# Patient Record
Sex: Male | Born: 1975 | Race: Black or African American | Hispanic: No | Marital: Single
Health system: Midwestern US, Community
[De-identification: ages and names within clinical notes are randomized; demographics above are authoritative.]

## PROBLEM LIST (undated history)

## (undated) HISTORY — PX: OTHER SURGICAL HISTORY: SHX169

---

## 2007-08-23 ENCOUNTER — Emergency Department: Payer: Self-pay | Admitting: Unknown Physician Specialty

## 2008-02-11 ENCOUNTER — Emergency Department: Payer: Self-pay | Admitting: Internal Medicine

## 2008-05-25 ENCOUNTER — Emergency Department: Payer: Self-pay | Admitting: Emergency Medicine

## 2008-06-20 ENCOUNTER — Emergency Department: Payer: Self-pay | Admitting: Emergency Medicine

## 2008-08-12 ENCOUNTER — Emergency Department: Payer: Self-pay | Admitting: Emergency Medicine

## 2008-12-02 ENCOUNTER — Emergency Department: Payer: Self-pay | Admitting: Internal Medicine

## 2009-07-17 ENCOUNTER — Inpatient Hospital Stay (HOSPITAL_COMMUNITY): Admission: EM | Admit: 2009-07-17 | Discharge: 2009-07-31 | Payer: Self-pay | Admitting: Emergency Medicine

## 2009-08-26 ENCOUNTER — Ambulatory Visit (HOSPITAL_COMMUNITY): Admission: RE | Admit: 2009-08-26 | Discharge: 2009-08-26 | Payer: Self-pay | Admitting: General Surgery

## 2009-09-22 ENCOUNTER — Emergency Department: Payer: Self-pay | Admitting: Emergency Medicine

## 2010-01-18 ENCOUNTER — Emergency Department: Payer: Self-pay | Admitting: Emergency Medicine

## 2010-02-23 ENCOUNTER — Emergency Department (HOSPITAL_BASED_OUTPATIENT_CLINIC_OR_DEPARTMENT_OTHER)
Admission: EM | Admit: 2010-02-23 | Discharge: 2010-02-23 | Disposition: A | Discharge status: Home / Self Care | Payer: Self-pay | Attending: Emergency Medicine | Admitting: Emergency Medicine

## 2010-02-23 DIAGNOSIS — R0602 Shortness of breath: Secondary | ICD-10-CM | POA: Insufficient documentation

## 2010-02-23 DIAGNOSIS — J45901 Unspecified asthma with (acute) exacerbation: Secondary | ICD-10-CM | POA: Insufficient documentation

## 2010-03-17 LAB — CBC
HCT: 42.3 % (ref 39.0–52.0)
Platelets: 184 10*3/uL (ref 150–400)
RBC: 4.96 MIL/uL (ref 4.22–5.81)

## 2010-03-17 LAB — SURGICAL PCR SCREEN
MRSA, PCR: NEGATIVE
Staphylococcus aureus: NEGATIVE

## 2010-03-18 LAB — ABO/RH: ABO/RH(D): B POS

## 2010-03-18 LAB — CBC
HCT: 36.7 % — ABNORMAL LOW (ref 39.0–52.0)
HCT: 37.7 % — ABNORMAL LOW (ref 39.0–52.0)
HCT: 43.5 % (ref 39.0–52.0)
Hemoglobin: 12.5 g/dL — ABNORMAL LOW (ref 13.0–17.0)
Hemoglobin: 12.7 g/dL — ABNORMAL LOW (ref 13.0–17.0)
Hemoglobin: 14 g/dL (ref 13.0–17.0)
MCH: 30.2 pg (ref 26.0–34.0)
MCHC: 34 g/dL (ref 30.0–36.0)
MCHC: 34.1 g/dL (ref 30.0–36.0)
MCV: 89.1 fL (ref 78.0–100.0)
MCV: 89.4 fL (ref 78.0–100.0)
Platelets: 132 10*3/uL — ABNORMAL LOW (ref 150–400)
Platelets: 143 10*3/uL — ABNORMAL LOW (ref 150–400)
Platelets: 206 10*3/uL (ref 150–400)
RBC: 4.21 MIL/uL — ABNORMAL LOW (ref 4.22–5.81)
RBC: 4.65 MIL/uL (ref 4.22–5.81)
RDW: 12.3 % (ref 11.5–15.5)
WBC: 12 10*3/uL — ABNORMAL HIGH (ref 4.0–10.5)
WBC: 8.1 10*3/uL (ref 4.0–10.5)
WBC: 9.9 10*3/uL (ref 4.0–10.5)

## 2010-03-18 LAB — COMPREHENSIVE METABOLIC PANEL
ALT: 20 U/L (ref 0–53)
Albumin: 3.9 g/dL (ref 3.5–5.2)
Alkaline Phosphatase: 51 U/L (ref 39–117)
BUN: 13 mg/dL (ref 6–23)
Calcium: 8.5 mg/dL (ref 8.4–10.5)
Glucose, Bld: 153 mg/dL — ABNORMAL HIGH (ref 70–99)
Potassium: 3.1 mEq/L — ABNORMAL LOW (ref 3.5–5.1)
Sodium: 140 mEq/L (ref 135–145)
Total Protein: 6.4 g/dL (ref 6.0–8.3)

## 2010-03-18 LAB — BASIC METABOLIC PANEL
CO2: 30 mEq/L (ref 19–32)
Calcium: 8 mg/dL — ABNORMAL LOW (ref 8.4–10.5)
Chloride: 96 mEq/L (ref 96–112)
Creatinine, Ser: 1.06 mg/dL (ref 0.4–1.5)
Creatinine, Ser: 1.21 mg/dL (ref 0.4–1.5)
GFR calc Af Amer: >60 mL/min (ref >60)
GFR calc Af Amer: >60 mL/min (ref >60)
GFR calc non Af Amer: >60 mL/min (ref >60)
Potassium: 3.7 mEq/L (ref 3.5–5.1)
Sodium: 131 mEq/L — ABNORMAL LOW (ref 135–145)
Sodium: 133 mEq/L — ABNORMAL LOW (ref 135–145)

## 2010-03-18 LAB — POCT I-STAT, CHEM 8
Creatinine, Ser: 1.4 mg/dL (ref 0.4–1.5)
Glucose, Bld: 145 mg/dL — ABNORMAL HIGH (ref 70–99)
Potassium: 3.1 mEq/L — ABNORMAL LOW (ref 3.5–5.1)

## 2010-03-18 LAB — PROTIME-INR
INR: 0.99 (ref 0.00–1.49)
Prothrombin Time: 13 seconds (ref 11.6–15.2)

## 2010-03-18 LAB — TYPE AND SCREEN: Antibody Screen: NEGATIVE

## 2010-03-18 LAB — LACTIC ACID, PLASMA: Lactic Acid, Venous: 4.1 mmol/L — ABNORMAL HIGH (ref 0.5–2.2)

## 2010-08-08 ENCOUNTER — Emergency Department (HOSPITAL_BASED_OUTPATIENT_CLINIC_OR_DEPARTMENT_OTHER)
Admission: EM | Admit: 2010-08-08 | Discharge: 2010-08-08 | Disposition: A | Discharge status: Home / Self Care | Payer: Self-pay | Attending: Emergency Medicine | Admitting: Emergency Medicine

## 2010-08-08 DIAGNOSIS — R0602 Shortness of breath: Secondary | ICD-10-CM | POA: Insufficient documentation

## 2010-08-08 DIAGNOSIS — J45909 Unspecified asthma, uncomplicated: Secondary | ICD-10-CM | POA: Insufficient documentation

## 2010-08-08 MED ORDER — IPRATROPIUM BROMIDE 0.02 % IN SOLN
0.5000 mg | Freq: Once | RESPIRATORY_TRACT | Status: AC
  Administered 2010-08-08: 0.5 mg via RESPIRATORY_TRACT
  Filled 2010-08-08: qty 2.5

## 2010-08-08 MED ORDER — FLUTICASONE PROPIONATE HFA 110 MCG/ACT IN AERO: 2.0000 | INHALATION_SPRAY | Freq: Two times a day (BID) | RESPIRATORY_TRACT | Status: DC

## 2010-08-08 MED ORDER — ALBUTEROL SULFATE HFA 108 (90 BASE) MCG/ACT IN AERS
INHALATION_SPRAY | RESPIRATORY_TRACT | Status: AC
  Filled 2010-08-08: qty 6.7

## 2010-08-08 MED ORDER — ALBUTEROL SULFATE (5 MG/ML) 0.5% IN NEBU
5.0000 mg | INHALATION_SOLUTION | Freq: Once | RESPIRATORY_TRACT | Status: AC
  Administered 2010-08-08: 5 mg via RESPIRATORY_TRACT
  Filled 2010-08-08: qty 1

## 2010-08-08 MED ORDER — ALBUTEROL SULFATE HFA 108 (90 BASE) MCG/ACT IN AERS: 1.0000 | INHALATION_SPRAY | Freq: Four times a day (QID) | RESPIRATORY_TRACT | Status: DC | PRN

## 2010-08-08 MED ORDER — PREDNISONE 10 MG PO TABS: 20.0000 mg | ORAL_TABLET | Freq: Every day | ORAL | Status: AC

## 2010-08-08 MED ORDER — PREDNISONE 20 MG PO TABS
60.0000 mg | ORAL_TABLET | Freq: Once | ORAL | Status: AC
  Administered 2010-08-08: 60 mg via ORAL
  Filled 2010-08-08: qty 3

## 2010-08-08 MED ORDER — ALBUTEROL SULFATE HFA 108 (90 BASE) MCG/ACT IN AERS
2.0000 | INHALATION_SPRAY | Freq: Once | RESPIRATORY_TRACT | Status: AC
  Administered 2010-08-08: 2 via RESPIRATORY_TRACT

## 2010-08-08 NOTE — ED Notes (Signed)
Albuterol MDI given to pt at DC per MD order.  RX label filled out per order. Pt states he demonstrates proper use of MDI with Aerochamber.

## 2010-08-08 NOTE — ED Notes (Signed)
MD at bedside.  Neb tx being administered.

## 2010-08-08 NOTE — ED Notes (Signed)
Pt has a hx of asthma and reports an increase in [facility] and having to use his inhaler.

## 2010-08-08 NOTE — ED Notes (Signed)
Pt requesting Albuterol inhaler prior to d/c.  MD informed and new orders received.

## 2010-08-08 NOTE — ED Provider Notes (Signed)
History     CSN: [id number] Arrival date & time: 08/08/2010  7:17 AM  Chief Complaint  Patient presents with  . Shortness of Breath  . Asthma   Patient is a 35 y.o. male presenting with shortness of breath and asthma. The history is provided by the patient. No language interpreter was used.  Shortness of Breath  The current episode started 5 to 7 days ago. The onset was gradual. The problem occurs frequently. The problem has been unchanged. The problem is moderate. The symptoms are relieved by beta-agonist inhalers. The symptoms are aggravated by smoke exposure and allergens. Associated symptoms include cough, shortness of breath and wheezing. Pertinent negatives include no chest pain, no chest pressure, no orthopnea, no fever, no rhinorrhea, no sore throat and no stridor. There was no intake of a foreign body. He has inhaled smoke recently. He has had no prior steroid use. He has had no prior hospitalizations. He has had no prior ICU admissions. He has had no prior intubations. His past medical history is significant for asthma. He has been behaving normally.  Asthma Associated symptoms include shortness of breath. Pertinent negatives include no chest pain.    Past Medical History  Diagnosis Date  . Asthma     Past Surgical History  Procedure Date  . Left knee surery   . Left hand surgery     No family history on file.  History  Substance Use Topics  . Smoking status: Former Smoker    Quit date: 06/08/2010  . Smokeless tobacco: Not on file  . Alcohol Use: No      Review of Systems  Constitutional: Negative for fever and activity change.  HENT: Negative for sore throat, facial swelling and rhinorrhea.   Eyes: Negative for discharge and itching.  Respiratory: Positive for cough, shortness of breath and wheezing. Negative for apnea, chest tightness and stridor.   Cardiovascular: Negative for chest pain and orthopnea.  Gastrointestinal: Negative for abdominal distention.    Genitourinary: Negative for difficulty urinating.  Musculoskeletal: Negative.   Neurological: Negative.   Hematological: Negative.   Psychiatric/Behavioral: Negative.     Physical Exam  BP 123/71  Pulse 80  Temp(Src) 97.9 F (36.6 C) (Oral)  Resp 20  Ht 5\' 11"  (1.803 m)  Wt 215 lb (97.523 kg)  BMI 29.99 kg/m2  SpO2 95%  Physical Exam  Constitutional: He is oriented to person, place, and time. He appears well-developed and well-nourished. No distress.  HENT:  Head: Normocephalic and atraumatic.  Eyes: EOM are normal. Pupils are equal, round, and reactive to light.  Neck: Normal range of motion. Neck supple.  Cardiovascular: Normal rate and regular rhythm.   Pulmonary/Chest: No stridor. No respiratory distress. He has wheezes. He has no rales.  Abdominal: Soft. Bowel sounds are normal.  Musculoskeletal: He exhibits no edema.  Neurological: He is alert and oriented to person, place, and time.  Skin: Skin is warm and dry.  Psychiatric: He has a normal mood and affect.    ED Course  Procedures  MDM   Patient 70% improved following one treatment.  Prednisone 60 mg given following first neb tx. Advised will start Flovent, steroids, and new inhaler. Return for chest pain DOE, SOB, productive cough, fevers or chills or any concerning symptoms. Patient advised he should follow up with PMD.  Expresses understanding and agrees to follow up      Garrison Michie Smitty Cords, MD 08/08/10 0800

## 2010-08-08 NOTE — ED Notes (Signed)
MD at bedside. New orders received for #2 neb tx.

## 2010-11-07 ENCOUNTER — Emergency Department (HOSPITAL_BASED_OUTPATIENT_CLINIC_OR_DEPARTMENT_OTHER)
Admission: EM | Admit: 2010-11-07 | Discharge: 2010-11-07 | Disposition: A | Discharge status: Home / Self Care | Payer: Self-pay | Attending: Emergency Medicine | Admitting: Emergency Medicine

## 2010-11-07 ENCOUNTER — Encounter (HOSPITAL_BASED_OUTPATIENT_CLINIC_OR_DEPARTMENT_OTHER): Payer: Self-pay | Admitting: *Deleted

## 2010-11-07 DIAGNOSIS — J45909 Unspecified asthma, uncomplicated: Secondary | ICD-10-CM | POA: Insufficient documentation

## 2010-11-07 MED ORDER — IPRATROPIUM BROMIDE 0.02 % IN SOLN
0.5000 mg | Freq: Once | RESPIRATORY_TRACT | Status: AC
  Administered 2010-11-07: 0.5 mg via RESPIRATORY_TRACT
  Filled 2010-11-07: qty 2.5

## 2010-11-07 MED ORDER — ALBUTEROL SULFATE HFA 108 (90 BASE) MCG/ACT IN AERS: 2.0000 | INHALATION_SPRAY | RESPIRATORY_TRACT | Status: DC | PRN

## 2010-11-07 MED ORDER — ALBUTEROL SULFATE (5 MG/ML) 0.5% IN NEBU
5.0000 mg | INHALATION_SOLUTION | Freq: Once | RESPIRATORY_TRACT | Status: AC
  Administered 2010-11-07: 5 mg via RESPIRATORY_TRACT
  Filled 2010-11-07: qty 1

## 2010-11-07 MED ORDER — PREDNISONE 20 MG PO TABS: 40.0000 mg | ORAL_TABLET | Freq: Every day | ORAL | Status: AC

## 2010-11-07 NOTE — ED Provider Notes (Signed)
History     CSN: [id number] Arrival date & time: 11/07/2010  1:12 PM   First MD Initiated Contact with Patient 11/07/10 1301      Chief Complaint  Patient presents with  . Asthma    (Consider location/radiation/quality/duration/timing/severity/associated sxs/prior treatment) Patient is a 35 y.o. male presenting with asthma. The history is provided by the patient. No language interpreter was used.  Asthma This is a recurrent problem. The current episode started in the past 7 days. The problem occurs constantly. The problem has been unchanged. Pertinent negatives include no coughing, fever, rash, sore throat or vomiting. The symptoms are aggravated by nothing. Treatments tried: inhaler. The treatment provided mild relief.  Asthma This is a recurrent problem. The current episode started in the past 7 days. The problem occurs constantly. The problem has been unchanged. The symptoms are aggravated by nothing. Treatments tried: inhaler. The treatment provided mild relief.    Past Medical History  Diagnosis Date  . Asthma     Past Surgical History  Procedure Date  . Left knee surery   . Left hand surgery     No family history on file.  History  Substance Use Topics  . Smoking status: Former Smoker    Quit date: 06/08/2010  . Smokeless tobacco: Not on file  . Alcohol Use: No      Review of Systems  Constitutional: Negative for fever.  HENT: Negative for sore throat.   Respiratory: Negative for cough.   Gastrointestinal: Negative for vomiting.  Skin: Negative for rash.  All other systems reviewed and are negative.    Allergies  Review of patient's allergies indicates no known allergies.  Home Medications   Current Outpatient Rx  Name Route Sig Dispense Refill  . ALBUTEROL SULFATE HFA 108 (90 BASE) MCG/ACT IN AERS Inhalation Inhale 1-2 puffs into the lungs every 6 (six) hours as needed for wheezing. 1 Inhaler 0  . ALBUTEROL SULFATE IN Inhalation Inhale into the  lungs as needed.      Marland Kitchen FLUTICASONE PROPIONATE  HFA 110 MCG/ACT IN AERO Inhalation Inhale 2 puffs into the lungs 2 (two) times daily. 1 Inhaler 0    BP 128/75  Pulse 78  Temp(Src) 98.6 F (37 C) (Oral)  Resp 22  Ht 5\' 11"  (1.803 m)  Wt 210 lb (95.255 kg)  BMI 29.29 kg/m2  SpO2 100%  Physical Exam  Nursing note and vitals reviewed. Constitutional: He is oriented to person, place, and time. He appears well-developed and well-nourished.  HENT:  Head: Normocephalic and atraumatic.  Right Ear: External ear normal.  Left Ear: External ear normal.  Mouth/Throat: Oropharynx is clear and moist.  Eyes: Pupils are equal, round, and reactive to light.  Neck: Normal range of motion. Neck supple.  Cardiovascular: Normal rate and regular rhythm.   Pulmonary/Chest: No accessory muscle usage. No respiratory distress. He has wheezes.  Abdominal: Soft. Bowel sounds are normal.  Musculoskeletal: Normal range of motion.  Neurological: He is alert and oriented to person, place, and time.  Skin: Skin is warm and dry.  Psychiatric: He has a normal mood and affect.    ED Course  Procedures (including critical care time)  Labs Reviewed - No data to display No results found.   1. Asthma       MDM  Pt no longer wheezing;will give him an inhaler for home   Medical screening examination/treatment/procedure(s) were performed by non-physician practitioner and as supervising physician I was immediately available for consultation/collaboration. Jorge Cooper  Thomas, M.D.     Teressa Lower, NP 11/07/10 1342  Jorge Cooper III, MD 11/07/10 [phone]

## 2010-11-07 NOTE — ED Notes (Signed)
Patient states he has chronic asthma.  States for the last 3-4 days he has had to use his Albuterol inhaler more often up to 5-6 times per day.
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
## 2010-12-12 ENCOUNTER — Emergency Department: Payer: Self-pay | Admitting: Emergency Medicine

## 2011-03-15 ENCOUNTER — Encounter (HOSPITAL_BASED_OUTPATIENT_CLINIC_OR_DEPARTMENT_OTHER): Payer: Self-pay | Admitting: Family Medicine

## 2011-03-15 ENCOUNTER — Emergency Department (HOSPITAL_BASED_OUTPATIENT_CLINIC_OR_DEPARTMENT_OTHER)
Admission: EM | Admit: 2011-03-15 | Discharge: 2011-03-15 | Disposition: A | Discharge status: Home / Self Care | Payer: Self-pay | Attending: Emergency Medicine | Admitting: Emergency Medicine

## 2011-03-15 DIAGNOSIS — Z87891 Personal history of nicotine dependence: Secondary | ICD-10-CM | POA: Insufficient documentation

## 2011-03-15 DIAGNOSIS — R0981 Nasal congestion: Secondary | ICD-10-CM

## 2011-03-15 DIAGNOSIS — B9789 Other viral agents as the cause of diseases classified elsewhere: Secondary | ICD-10-CM | POA: Insufficient documentation

## 2011-03-15 DIAGNOSIS — J069 Acute upper respiratory infection, unspecified: Secondary | ICD-10-CM | POA: Insufficient documentation

## 2011-03-15 DIAGNOSIS — J45909 Unspecified asthma, uncomplicated: Secondary | ICD-10-CM | POA: Insufficient documentation

## 2011-03-15 MED ORDER — PREDNISONE 20 MG PO TABS: 40.0000 mg | ORAL_TABLET | Freq: Every day | ORAL | Status: AC

## 2011-03-15 MED ORDER — BENZONATATE 100 MG PO CAPS: 100.0000 mg | ORAL_CAPSULE | Freq: Two times a day (BID) | ORAL | Status: AC | PRN

## 2011-03-15 MED ORDER — LORATADINE-PSEUDOEPHEDRINE ER 5-120 MG PO TB12: 1.0000 | ORAL_TABLET | Freq: Two times a day (BID) | ORAL | Status: AC

## 2011-03-15 NOTE — ED Provider Notes (Signed)
History     CSN: [id number]  Arrival date & time 03/15/11  4098   First MD Initiated Contact with Patient 03/15/11 [phone]      Chief Complaint  Patient presents with  . Cough  . Nasal Congestion    (Consider location/radiation/quality/duration/timing/severity/associated sxs/prior treatment) Patient is a 36 y.o. male presenting with URI. The history is provided by the patient.  URI The primary symptoms include cough and wheezing. Primary symptoms do not include fever, fatigue, headaches, ear pain, sore throat, swollen glands, abdominal pain, nausea, vomiting, myalgias, arthralgias or rash. The current episode started 3 to 5 days ago. This is a new problem. The problem has been gradually worsening.  The cough began 3 to 5 days ago. The cough is new. The cough is productive. The sputum is yellow.  Wheezing began yesterday. Wheezing occurs intermittently. The wheezing has been resolved since its onset. The patient's medical history is significant for asthma.  The onset of the illness is associated with exposure to sick contacts. Symptoms associated with the illness include rhinorrhea. The illness is not associated with chills, plugged ear sensation, facial pain, sinus pressure or congestion.    Past Medical History  Diagnosis Date  . Asthma     Past Surgical History  Procedure Date  . Left knee surery   . Left hand surgery     No family history on file.  History  Substance Use Topics  . Smoking status: Former Smoker    Quit date: 06/08/2010  . Smokeless tobacco: Not on file  . Alcohol Use: 0.0 oz/week    1-2 Cans of beer per week     daily      Review of Systems  Constitutional: Negative for fever, chills, diaphoresis, activity change, appetite change, fatigue and unexpected weight change.  HENT: Positive for rhinorrhea. Negative for hearing loss, ear pain, nosebleeds, congestion, sore throat, facial swelling, sneezing, drooling, mouth sores, trouble swallowing, neck  pain, neck stiffness, dental problem, voice change, postnasal drip, sinus pressure, tinnitus and ear discharge.   Eyes: Negative for photophobia, pain, discharge, redness, itching and visual disturbance.  Respiratory: Positive for cough and wheezing. Negative for chest tightness and shortness of breath.   Cardiovascular: Negative for chest pain and leg swelling.  Gastrointestinal: Negative for nausea, vomiting, abdominal pain and diarrhea.  Genitourinary: Negative for dysuria and flank pain.  Musculoskeletal: Negative for myalgias, back pain, joint swelling and arthralgias.  Skin: Negative for color change, pallor, rash and wound.  Neurological: Negative for dizziness, speech difficulty, light-headedness, numbness and headaches.  Hematological: Negative for adenopathy.  Psychiatric/Behavioral: Negative.     Allergies  Review of patient's allergies indicates no known allergies.  Home Medications   Current Outpatient Rx  Name Route Sig Dispense Refill  . ALBUTEROL SULFATE HFA 108 (90 BASE) MCG/ACT IN AERS Inhalation Inhale 1-2 puffs into the lungs every 6 (six) hours as needed for wheezing. 1 Inhaler 0  . ALBUTEROL SULFATE HFA 108 (90 BASE) MCG/ACT IN AERS Inhalation Inhale 2 puffs into the lungs every 4 (four) hours as needed for wheezing. 1 Inhaler 0  . ALBUTEROL SULFATE IN Inhalation Inhale into the lungs as needed.      Marland Kitchen BENZONATATE 100 MG PO CAPS Oral Take 1 capsule (100 mg total) by mouth 2 (two) times daily as needed for cough. 14 capsule 0  . FLUTICASONE PROPIONATE  HFA 110 MCG/ACT IN AERO Inhalation Inhale 2 puffs into the lungs 2 (two) times daily. 1 Inhaler 0  . LORATADINE-PSEUDOEPHEDRINE  ER 5-120 MG PO TB12 Oral Take 1 tablet by mouth 2 (two) times daily. As needed for nasal congestion 10 tablet 0  . PREDNISONE 20 MG PO TABS Oral Take 2 tablets (40 mg total) by mouth daily. 10 tablet 0    BP 144/63  Pulse 60  Temp(Src) 97.6 F (36.4 C) (Oral)  Resp 16  Ht 5\' 11"  (1.803 m)   Wt 203 lb (92.08 kg)  BMI 28.31 kg/m2  SpO2 100%  Physical Exam  Nursing note and vitals reviewed. Constitutional: He is oriented to person, place, and time. He appears well-developed and well-nourished. No distress.  HENT:  Head: Normocephalic and atraumatic. Head is without raccoon's eyes, without Battle's sign, without right periorbital erythema and without left periorbital erythema.  Right Ear: Hearing, tympanic membrane, external ear and ear canal normal.  Left Ear: Hearing, tympanic membrane, external ear and ear canal normal.  Nose: Mucosal edema and rhinorrhea present. Right sinus exhibits no maxillary sinus tenderness and no frontal sinus tenderness. Left sinus exhibits no maxillary sinus tenderness and no frontal sinus tenderness.  Mouth/Throat: Uvula is midline, oropharynx is clear and moist and mucous membranes are normal. Mucous membranes are not dry. No oropharyngeal exudate, posterior oropharyngeal edema, posterior oropharyngeal erythema or tonsillar abscesses.  Eyes: Conjunctivae and EOM are normal.  Neck: Normal range of motion. Neck supple. No JVD present. No tracheal deviation present.  Cardiovascular: Normal rate, regular rhythm, normal heart sounds and intact distal pulses.  Exam reveals no gallop and no friction rub.   No murmur heard. Pulmonary/Chest: Effort normal and breath sounds normal. No stridor. No respiratory distress. He has no wheezes. He has no rales. He exhibits no tenderness.  Abdominal: Soft. Bowel sounds are normal. He exhibits no distension. There is no tenderness. There is no rebound and no guarding.  Musculoskeletal: Normal range of motion. He exhibits no edema and no tenderness.  Lymphadenopathy:    He has no cervical adenopathy.  Neurological: He is alert and oriented to person, place, and time. He has normal reflexes. No cranial nerve deficit. He exhibits normal muscle tone. Coordination normal.  Skin: Skin is warm and dry. No rash noted. He is not  diaphoretic. No erythema. No pallor.  Psychiatric: He has a normal mood and affect. His behavior is normal. Judgment and thought content normal.    ED Course  Procedures (including critical care time)  Labs Reviewed - No data to display No results found.   1. Viral upper respiratory tract infection with cough   2. Nasal congestion       MDM  The patient has findings of a viral upper respiratory tract infection with nasal congestion and a productive cough, but his lung exam does not suggest pneumonia. He is not currently wheezing, but has a history of asthma and reports increased exacerbations with upper respiratory tract infection, controlled by albuterol. I do not see indication for a chest x-ray as I do not suspect pneumonia, and I do suspect a viral upper respiratory tract infection which I will treat symptomatically with decongestant and cough suppressant, and due to the patient's history of asthma and more frequent recent exacerbations, I will give him a short course of steroid. The patient states his understanding of and agreement with the plan of care.        Felisa Bonier, MD 03/15/11 1016

## 2011-03-15 NOTE — ED Notes (Signed)
Pt c/o cough productive of yellow sputum, nasal congestion and "chest cold" x 2 days.

## 2011-03-15 NOTE — Discharge Instructions (Signed)
Antibiotic Nonuse  Your caregiver felt that the infection or problem was not one that would be helped with an antibiotic. Infections may be caused by viruses or bacteria. Only a caregiver can tell which one of these is the likely cause of an illness. A cold is the most common cause of infection in both adults and children. A cold is a virus. Antibiotic treatment will have no effect on a viral infection. Viruses can lead to many lost days of work caring for sick children and many missed days of school. Children may catch as many as 10 "colds" or "flus" per year during which they can be tearful, cranky, and uncomfortable. The goal of treating a virus is aimed at keeping the ill person comfortable. Antibiotics are medications used to help the body fight bacterial infections. There are relatively few types of bacteria that cause infections but there are hundreds of viruses. While both viruses and bacteria cause infection they are very different types of germs. A viral infection will typically go away by itself within 7 to 10 days. Bacterial infections may spread or get worse without antibiotic treatment. Examples of bacterial infections are:  Sore throats (like strep throat or tonsillitis).   Infection in the lung (pneumonia).   Ear and skin infections.  Examples of viral infections are:  Colds or flus.   Most coughs and bronchitis.   Sore throats not caused by Strep.   Runny noses.  It is often best not to take an antibiotic when a viral infection is the cause of the problem. Antibiotics can kill off the helpful bacteria that we have inside our body and allow harmful bacteria to start growing. Antibiotics can cause side effects such as allergies, nausea, and diarrhea without helping to improve the symptoms of the viral infection. Additionally, repeated uses of antibiotics can cause bacteria inside of our body to become resistant. That resistance can be passed onto harmful bacterial. The next time  you have an infection it may be harder to treat if antibiotics are used when they are not needed. Not treating with antibiotics allows our own immune system to develop and take care of infections more efficiently. Also, antibiotics will work better for [place] when they are prescribed for bacterial infections. Treatments for a child that is ill may include:  Give extra fluids throughout the day to stay hydrated.   Get plenty of rest.   Only give your child over-the-counter or prescription medicines for pain, discomfort, or fever as directed by your caregiver.   The use of a cool mist humidifier may help stuffy noses.   Cold medications if suggested by your caregiver.  Your caregiver may decide to start you on an antibiotic if:  The problem you were seen for today continues for a longer length of time than expected.   You develop a secondary bacterial infection.  SEEK MEDICAL CARE IF:  Fever lasts longer than 5 days.   Symptoms continue to get worse after 5 to 7 days or become severe.   Difficulty in breathing develops.   Signs of dehydration develop (poor drinking, rare urinating, dark colored urine).   Changes in behavior or worsening tiredness (listlessness or lethargy).  Document Released: 02/26/2001 Document Revised: 12/07/2010 Document Reviewed: 08/25/2008 [facility] Patient Information 2012 [place], [place].Cool Mist Vaporizers Vaporizers may help relieve the symptoms of a cough and cold. By adding water to the air, mucus may become thinner and less sticky. This makes it easier to breathe and cough up secretions. Vaporizers  have not been proven to show they help with colds. You should not use a vaporizer if you are allergic to mold. Cool mist vaporizers do not cause serious burns like hot mist vaporizers ("steamers"). HOME CARE INSTRUCTIONS  Follow the package instructions for your vaporizer.   Use a vaporizer that holds a large volume of water (1 to 2 gallons [5.7 to 7.5 liters]).     Do not use anything other than distilled water in the vaporizer.   Do not run the vaporizer all of the time. This can cause mold or bacteria to grow in the vaporizer.   Clean the vaporizer after each time you use it.   Clean and dry the vaporizer well before you store it.   Stop using a vaporizer if you develop worsening respiratory symptoms.  Document Released: 09/15/2003 Document Revised: 12/07/2010 Document Reviewed: 08/12/2008 [facility] Patient Information 2012 [place], [place].Saline Nose Drops  To help clear a stuffy nose, put salt water (saline) nose drops in your infant's nose. This helps to loosen the secretions in the nose. Use a bulb syringe to clean the nose out:  Before feeding.   Before putting your infant down for naps.   No more than once every 3 hours to avoid irritating your infant's nostrils.  HOME CARE  Buy nose drops at your local drug store. You can also make nose drops yourself. Mix 1 cup of water with  teaspoon of salt. Stir. Store this mixture at room temperature. Make a new batch daily.   To use the drops:   Put 1 or 2 drops in each side of infant's nose with a clean medicine dropper. Do not use this dropper for any other medicine.   Squeeze the air out of the suction bulb before inserting it into your infant's nose.   While still squeezing the bulb flat, place the tip of the bulb into a nostril. Let air come back into the bulb. The suction will pull snot out of the nose and into the bulb.   Repeat on other nostril.   Squeeze the bulb several times into a tissue and wash the bulb tip in soapy water. Store the bulb with the tip side down on paper towel.   Use the bulb syringe with only the saline drops to avoid irritating your infant's nostrils.  GET HELP RIGHT AWAY IF:  The snot changes to green or yellow.   The snot gets thicker.   Your infant is 3 months or younger with a rectal temperature of 100.4 F (38 C) or higher.   Your infant is older  than 3 months with a rectal temperature of 102 F (38.9 C) or higher.   The stuffy nose lasts 10 days or longer.   There is trouble breathing or feeding.  MAKE SURE YOU:  Understand these instructions.   Will watch your infant's condition.   Will get help right away if your infant is not doing well or gets worse.  Document Released: 10/15/2008 Document Revised: 12/07/2010 Document Reviewed: 10/15/2008 [facility] Patient Information 2012 [place], [place].Upper Respiratory Infection, Adult An upper respiratory infection (URI) is also sometimes known as the common cold. The upper respiratory tract includes the nose, sinuses, throat, trachea, and bronchi. Bronchi are the airways leading to the lungs. Most people improve within 1 week, but symptoms can last up to 2 weeks. A residual cough may last even longer.  CAUSES Many different viruses can infect the tissues lining the upper respiratory tract. The tissues become irritated  and inflamed and often become very moist. Mucus production is also common. A cold is contagious. You can easily spread the virus to others by oral contact. This includes kissing, sharing a glass, coughing, or sneezing. Touching your mouth or nose and then touching a surface, which is then touched by another person, can also spread the virus. SYMPTOMS  Symptoms typically develop 1 to 3 days after you come in contact with a cold virus. Symptoms vary from person to person. They may include:  Runny nose.   Sneezing.   Nasal congestion.   Sinus irritation.   Sore throat.   Loss of voice (laryngitis).   Cough.   Fatigue.   Muscle aches.   Loss of appetite.   Headache.   Low-grade fever.  DIAGNOSIS  You might diagnose your own cold based on familiar symptoms, since most people get a cold 2 to 3 times a year. Your caregiver can confirm this based on your exam. Most importantly, your caregiver can check that your symptoms are not due to another disease such as strep  throat, sinusitis, pneumonia, asthma, or epiglottitis. Blood tests, throat tests, and X-rays are not necessary to diagnose a common cold, but they may sometimes be helpful in excluding other more serious diseases. Your caregiver will decide if any further tests are required. RISKS AND COMPLICATIONS  You may be at risk for a more severe case of the common cold if you smoke cigarettes, have chronic heart disease (such as heart failure) or lung disease (such as asthma), or if you have a weakened immune system. The very young and very old are also at risk for more serious infections. Bacterial sinusitis, middle ear infections, and bacterial pneumonia can complicate the common cold. The common cold can worsen asthma and chronic obstructive pulmonary disease (COPD). Sometimes, these complications can require emergency medical care and may be life-threatening. PREVENTION  The best way to protect against getting a cold is to practice good hygiene. Avoid oral or hand contact with people with cold symptoms. Wash your hands often if contact occurs. There is no clear evidence that vitamin C, vitamin E, echinacea, or exercise reduces the chance of developing a cold. However, it is always recommended to get plenty of rest and practice good nutrition. TREATMENT  Treatment is directed at relieving symptoms. There is no cure. Antibiotics are not effective, because the infection is caused by a virus, not by bacteria. Treatment may include:  Increased fluid intake. Sports drinks offer valuable electrolytes, sugars, and fluids.   Breathing heated mist or steam (vaporizer or shower).   Eating chicken soup or other clear broths, and maintaining good nutrition.   Getting plenty of rest.   Using gargles or lozenges for comfort.   Controlling fevers with ibuprofen or acetaminophen as directed by your caregiver.   Increasing usage of your inhaler if you have asthma.  Zinc gel and zinc lozenges, taken in the first 24  hours of the common cold, can shorten the duration and lessen the severity of symptoms. Pain medicines may help with fever, muscle aches, and throat pain. A variety of non-prescription medicines are available to treat congestion and runny nose. Your caregiver can make recommendations and may suggest nasal or lung inhalers for other symptoms.  HOME CARE INSTRUCTIONS   Only take over-the-counter or prescription medicines for pain, discomfort, or fever as directed by your caregiver.   Use a warm mist humidifier or inhale steam from a shower to increase air moisture. This may keep secretions  moist and make it easier to breathe.   Drink enough water and fluids to keep your urine clear or pale yellow.   Rest as needed.   Return to work when your temperature has returned to normal or as your caregiver advises. You may need to stay home longer to avoid infecting others. You can also use a face mask and careful hand washing to prevent spread of the virus.  SEEK MEDICAL CARE IF:   After the first few days, you feel you are getting worse rather than better.   You need your caregiver's advice about medicines to control symptoms.   You develop chills, worsening shortness of breath, or brown or red sputum. These may be signs of pneumonia.   You develop yellow or brown nasal discharge or pain in the face, especially when you bend forward. These may be signs of sinusitis.   You develop a fever, swollen neck glands, pain with swallowing, or white areas in the back of your throat. These may be signs of strep throat.  SEEK IMMEDIATE MEDICAL CARE IF:   You have a fever.   You develop severe or persistent headache, ear pain, sinus pain, or chest pain.   You develop wheezing, a prolonged cough, cough up blood, or have a change in your usual mucus (if you have chronic lung disease).   You develop sore muscles or a stiff neck.  Document Released: 06/13/2000 Document Revised: 12/07/2010 Document Reviewed:  04/21/2010 [facility] Patient Information 2012 [place], [place].Viral Infections A viral infection can be caused by different types of viruses.Most viral infections are not serious and resolve on their own. However, some infections may cause severe symptoms and may lead to further complications. SYMPTOMS Viruses can frequently cause:  Minor sore throat.   Aches and pains.   Headaches.   Runny nose.   Different types of rashes.   Watery eyes.   Tiredness.   Cough.   Loss of appetite.   Gastrointestinal infections, resulting in nausea, vomiting, and diarrhea.  These symptoms do not respond to antibiotics because the infection is not caused by bacteria. However, you might catch a bacterial infection following the viral infection. This is sometimes called a "superinfection." Symptoms of such a bacterial infection may include:  Worsening sore throat with pus and difficulty swallowing.   Swollen neck glands.   Chills and a high or persistent fever.   Severe headache.   Tenderness over the sinuses.   Persistent overall ill feeling (malaise), muscle aches, and tiredness (fatigue).   Persistent cough.   Yellow, green, or brown mucus production with coughing.  HOME CARE INSTRUCTIONS   Only take over-the-counter or prescription medicines for pain, discomfort, diarrhea, or fever as directed by your caregiver.   Drink enough water and fluids to keep your urine clear or pale yellow. Sports drinks can provide valuable electrolytes, sugars, and hydration.   Get plenty of rest and maintain proper nutrition. Soups and broths with crackers or rice are fine.  SEEK IMMEDIATE MEDICAL CARE IF:   You have severe headaches, shortness of breath, chest pain, neck pain, or an unusual rash.   You have uncontrolled vomiting, diarrhea, or you are unable to keep down fluids.   You or your child has an oral temperature above 102 F (38.9 C), not controlled by medicine.   Your baby is older  than 3 months with a rectal temperature of 102 F (38.9 C) or higher.   Your baby is 67 months old or younger with a  rectal temperature of 100.4 F (38 C) or higher.  MAKE SURE YOU:   Understand these instructions.   Will watch your condition.   Will get help right away if you are not doing well or get worse.  Document Released: 09/27/2004 Document Revised: 12/07/2010 Document Reviewed: 04/24/2010  [facility] Patient Information 2012 [place], [place]. RESOURCE GUIDE  Dental Problems  Patients with Medicaid: [facility] [phone] [address]                                           [phone] [address] Phone:  [phone]                                                  Phone:  [phone]  If unable to pay or uninsured, contact:  [facility] or [facility]. to become qualified for the adult dental clinic.  Chronic Pain Problems Contact [facility]  [phone] Patients need to be referred by their primary care doctor.  Insufficient Money for Medicine Contact [employer]:  call "211" or [facility] (831) 135-8187.  No Primary Care Doctor Call [facility]  972-730-9262 Other agencies that provide inexpensive medical care    [facility]  [phone]    [facility]  [phone]    [facility]  [phone]    [facility]  [phone]    [facility]  6617200311    [facility]  [phone]  Psychological Services [facility]  [phone] [facility] Services  [phone] [facility]   [phone] (emergency services [phone])  Substance Abuse Resources [facility] Drug Services  [phone] [facility] 418-030-6463 [facility] 6676209239 Floydene Flock (917)198-2525 Residential & Outpatient Substance Abuse Program  450-845-9619  Abuse/Neglect [facility] Child Abuse Hotline (581) 824-7337 [facility] Child Abuse Hotline (254)790-0998 (After Hours)  Emergency Shelter [facility] [phone]  M[facility] [place] [facility] [phone] [facility] [phone]  MRSA Hotline #:   8202278252    [facility] Resources  [facility]     [employer]                          [facility] [address] [place]                       [address]      [address]  [place]                                                Cristobal Goldmann Phone:  [phone]  Phone:  [phone]                 Phone:  [phone]  [facility] Mental Health Phone:  [phone]  [facility] Child Abuse Hotline [phone] [phone] (After Hours)

## 2011-04-25 ENCOUNTER — Encounter (HOSPITAL_BASED_OUTPATIENT_CLINIC_OR_DEPARTMENT_OTHER): Payer: Self-pay | Admitting: *Deleted

## 2011-04-25 ENCOUNTER — Emergency Department (HOSPITAL_BASED_OUTPATIENT_CLINIC_OR_DEPARTMENT_OTHER)
Admission: EM | Admit: 2011-04-25 | Discharge: 2011-04-25 | Disposition: A | Discharge status: Home / Self Care | Payer: Self-pay | Attending: Emergency Medicine | Admitting: Emergency Medicine

## 2011-04-25 DIAGNOSIS — J45909 Unspecified asthma, uncomplicated: Secondary | ICD-10-CM | POA: Insufficient documentation

## 2011-04-25 DIAGNOSIS — R05 Cough: Secondary | ICD-10-CM | POA: Insufficient documentation

## 2011-04-25 DIAGNOSIS — Z87891 Personal history of nicotine dependence: Secondary | ICD-10-CM | POA: Insufficient documentation

## 2011-04-25 DIAGNOSIS — R059 Cough, unspecified: Secondary | ICD-10-CM | POA: Insufficient documentation

## 2011-04-25 MED ORDER — PREDNISONE 10 MG PO TABS: 20.0000 mg | ORAL_TABLET | Freq: Every day | ORAL | Status: DC

## 2011-04-25 NOTE — ED Provider Notes (Signed)
History     CSN: [id number]  Arrival date & time 04/25/11  [id number]   First MD Initiated Contact with Patient 04/25/11 [phone]      Chief Complaint  Patient presents with  . Cough    (Consider location/radiation/quality/duration/timing/severity/associated sxs/prior treatment) HPI  Patient with history of asthma and now with increased wheezing since last night.  Using albuterol mdi 4-5 times per day.  Patient on advair qam.  Cough with yellow sputum.  Sinuses draining with nasal congestion.  Patient not taking claritin but taking otc sinus pills.  Patient with seasonal allergies worse nowl  No fever or dyspnea.  Patient quit smoking several months ago, no pmd   Past Medical History  Diagnosis Date  . Asthma     Past Surgical History  Procedure Date  . Left knee surery   . Left hand surgery     History reviewed. No pertinent family history.  History  Substance Use Topics  . Smoking status: Former Smoker    Quit date: 06/08/2010  . Smokeless tobacco: Not on file  . Alcohol Use: 0.0 oz/week    1-2 Cans of beer per week     daily      Review of Systems  All other systems reviewed and are negative.    Allergies  Review of patient's allergies indicates no known allergies.  Home Medications   Current Outpatient Rx  Name Route Sig Dispense Refill  . ALBUTEROL SULFATE HFA 108 (90 BASE) MCG/ACT IN AERS Inhalation Inhale 1-2 puffs into the lungs every 6 (six) hours as needed for wheezing. 1 Inhaler 0  . ALBUTEROL SULFATE HFA 108 (90 BASE) MCG/ACT IN AERS Inhalation Inhale 2 puffs into the lungs every 4 (four) hours as needed for wheezing. 1 Inhaler 0  . ALBUTEROL SULFATE IN Inhalation Inhale into the lungs as needed.      Marland Kitchen FLUTICASONE PROPIONATE  HFA 110 MCG/ACT IN AERO Inhalation Inhale 2 puffs into the lungs 2 (two) times daily. 1 Inhaler 0  . LORATADINE-PSEUDOEPHEDRINE ER 5-120 MG PO TB12 Oral Take 1 tablet by mouth 2 (two) times daily. As needed for nasal congestion 10  tablet 0    BP 131/74  Pulse 74  Temp(Src) 98.1 F (36.7 C) (Oral)  Resp 18  SpO2 99%  Physical Exam  Nursing note and vitals reviewed. Constitutional: He is oriented to person, place, and time. He appears well-developed and well-nourished.  HENT:  Head: Normocephalic and atraumatic.  Right Ear: External ear normal.  Left Ear: External ear normal.  Nose: Nose normal.  Mouth/Throat: Oropharynx is clear and moist.  Eyes: Conjunctivae and EOM are normal. Pupils are equal, round, and reactive to light.  Neck: Normal range of motion. Neck supple.  Cardiovascular: Normal rate, regular rhythm, normal heart sounds and intact distal pulses.   Pulmonary/Chest: Effort normal and breath sounds normal.       No wheezing but prolonged expiration  Abdominal: Soft. Bowel sounds are normal.  Musculoskeletal: Normal range of motion.  Neurological: He is alert and oriented to person, place, and time. He has normal reflexes.  Skin: Skin is warm and dry.  Psychiatric: He has a normal mood and affect. His behavior is normal. Thought content normal.    ED Course  Procedures (including critical care time)  Labs Reviewed - No data to display No results found.   No diagnosis found.    MDM  Patient with history of asthma and has had increased symptoms consistent with increased seasonal allergies.  He has not been taking a long-acting histamine blocker. He has been using his Flovent only once daily. He is not currently wheezing and does not appear to be in any distress. He is advised to increase his Advair to twice a day. He is advised to take Claritin daily. He'll be placed on a short episode of prednisone. He is given referral for followup to primary care.        Hilario Quarry, MD 04/25/11 [phone]

## 2011-04-25 NOTE — ED Notes (Signed)
C/o productive cough for few days and SOB

## 2011-04-25 NOTE — ED Notes (Signed)
Pt has asthma but sates that its his allergies that may be triggering his asthma causing him to cough.

## 2011-04-25 NOTE — Discharge Instructions (Signed)
Asthma Attack Prevention HOW CAN ASTHMA BE PREVENTED? Currently, there is no way to prevent asthma from starting. However, you can take steps to control the disease and prevent its symptoms after you have been diagnosed. Learn about your asthma and how to control it. Take an active role to control your asthma by working with your caregiver to create and follow an asthma action plan. An asthma action plan guides you in taking your medicines properly, avoiding factors that make your asthma worse, tracking your level of asthma control, responding to worsening asthma, and seeking emergency care when needed. To track your asthma, keep records of your symptoms, check your peak flow number using a peak flow meter (handheld device that shows how well air moves out of your lungs), and get regular asthma checkups.  Other ways to prevent asthma attacks include:  Use medicines as your caregiver directs.   Identify and avoid things that make your asthma worse (as much as you can).   Keep track of your asthma symptoms and level of control.   Get regular checkups for your asthma.   With your caregiver, write a detailed plan for taking medicines and managing an asthma attack. Then be sure to follow your action plan. Asthma is an ongoing condition that needs regular monitoring and treatment.   Identify and avoid asthma triggers. A number of outdoor allergens and irritants (pollen, mold, cold air, air pollution) can trigger asthma attacks. Find out what causes or makes your asthma worse, and take steps to avoid those triggers (see below).   Monitor your breathing. Learn to recognize warning signs of an attack, such as slight coughing, wheezing or shortness of breath. However, your lung function may already decrease before you notice any signs or symptoms, so regularly measure and record your peak airflow with a home peak flow meter.   Identify and treat attacks early. If you act quickly, you're less likely to have  a severe attack. You will also need less medicine to control your symptoms. When your peak flow measurements decrease and alert you to an upcoming attack, take your medicine as instructed, and immediately stop any activity that may have triggered the attack. If your symptoms do not improve, get medical help.   Pay attention to increasing quick-relief inhaler use. If you find yourself relying on your quick-relief inhaler (such as albuterol), your asthma is not under control. See your caregiver about adjusting your treatment.  IDENTIFY AND CONTROL FACTORS THAT MAKE YOUR ASTHMA WORSE A number of common things can set off or make your asthma symptoms worse (asthma triggers). Keep track of your asthma symptoms for several weeks, detailing all the environmental and emotional factors that are linked with your asthma. When you have an asthma attack, go back to your asthma diary to see which factor, or combination of factors, might have contributed to it. Once you know what these factors are, you can take steps to control many of them.  Allergies: If you have allergies and asthma, it is important to take asthma prevention steps at home. Asthma attacks (worsening of asthma symptoms) can be triggered by allergies, which can cause temporary increased inflammation of your airways. Minimizing contact with the substance to which you are allergic will help prevent an asthma attack. Animal Dander:   Some people are allergic to the flakes of skin or dried saliva from animals with fur or feathers. Keep these pets out of your home.   If you can't keep a pet outdoors, keep the   pet out of your bedroom and other sleeping areas at all times, and keep the door closed.   Remove carpets and furniture covered with cloth from your home. If that is not possible, keep the pet away from fabric-covered furniture and carpets.  Dust Mites:  Many people with asthma are allergic to dust mites. Dust mites are tiny bugs that are found in  every home, in mattresses, pillows, carpets, fabric-covered furniture, bedcovers, clothes, stuffed toys, fabric, and other fabric-covered items.   Cover your mattress in a special dust-proof cover.   Cover your pillow in a special dust-proof cover, or wash the pillow each week in hot water. Water must be hotter than 130 F to kill dust mites. Cold or warm water used with detergent and bleach can also be effective.   Wash the sheets and blankets on your bed each week in hot water.   Try not to sleep or lie on cloth-covered cushions.   Call ahead when traveling and ask for a smoke-free hotel room. Bring your own bedding and pillows, in case the hotel only supplies feather pillows and down comforters, which may contain dust mites and cause asthma symptoms.   Remove carpets from your bedroom and those laid on concrete, if you can.   Keep stuffed toys out of the bed, or wash the toys weekly in hot water or cooler water with detergent and bleach.  Cockroaches:  Many people with asthma are allergic to the droppings and remains of cockroaches.   Keep food and garbage in closed containers. Never leave food out.   Use poison baits, traps, powders, gels, or paste (for example, boric acid).   If a spray is used to kill cockroaches, stay out of the room until the odor goes away.  Indoor Mold:  Fix leaky faucets, pipes, or other sources of water that have mold around them.   Clean moldy surfaces with a cleaner that has bleach in it.  Pollen and Outdoor Mold:  When pollen or mold spore counts are high, try to keep your windows closed.   Stay indoors with windows closed from late morning to afternoon, if you can. Pollen and some mold spore counts are highest at that time.   Ask your caregiver whether you need to take or increase anti-inflammatory medicine before your allergy season starts.  Irritants:   Tobacco smoke is an irritant. If you smoke, ask your caregiver how you can quit. Ask family  members to quit smoking, too. Do not allow smoking in your home or car.   If possible, do not use a wood-burning stove, kerosene heater, or fireplace. Minimize exposure to all sources of smoke, including incense, candles, fires, and fireworks.   Try to stay away from strong odors and sprays, such as perfume, talcum powder, hair spray, and paints.   Decrease humidity in your home and use an indoor air cleaning device. Reduce indoor humidity to below 60 percent. Dehumidifiers or central air conditioners can do this.   Try to have someone else vacuum for you once or twice a week, if you can. Stay out of rooms while they are being vacuumed and for a short while afterward.   If you vacuum, use a dust mask from a hardware store, a double-layered or microfilter vacuum cleaner bag, or a vacuum cleaner with a HEPA filter.   Sulfites in foods and beverages can be irritants. Do not drink beer or wine, or eat dried fruit, processed potatoes, or shrimp if they cause asthma   symptoms.  °· Cold air can trigger an asthma attack. Cover your nose and mouth with a scarf on cold or windy days.  °· Several health conditions can make asthma more difficult to manage, including runny nose, sinus infections, reflux disease, psychological stress, and sleep apnea. Your caregiver will treat these conditions, as well.  °· Avoid close contact with people who have a cold or the flu, since your asthma symptoms may get worse if you catch the infection from them. Wash your hands thoroughly after touching items that may have been handled by people with a respiratory infection.  °· Get a flu shot every year to protect against the flu virus, which often makes asthma worse for days or weeks. Also get a pneumonia shot once every five to 10 years.  °Drugs: °· Aspirin and other painkillers can cause asthma attacks. 10% to 20% of people with asthma have sensitivity to aspirin or a group of painkillers called non-steroidal anti-inflammatory drugs  (NSAIDS), such as ibuprofen and naproxen. These drugs are used to treat pain and reduce fevers. Asthma attacks caused by any of these medicines can be severe and even fatal. These drugs must be avoided in people who have known aspirin sensitive asthma. Products with acetaminophen are considered safe for people who have asthma. It is important that people with aspirin sensitivity read labels of all over-the-counter drugs used to treat pain, colds, coughs, and fever.  °· Beta blockers and ACE inhibitors are other drugs which you should discuss with your caregiver, in relation to your asthma.  °ALLERGY SKIN TESTING  °Ask your asthma caregiver about allergy skin testing or blood testing (RAST test) to identify the allergens to which you are sensitive. If you are found to have allergies, allergy shots (immunotherapy) for asthma may help prevent future allergies and asthma. With allergy shots, small doses of allergens (substances to which you are allergic) are injected under your skin on a regular schedule. Over a period of time, your body may become used to the allergen and less responsive with asthma symptoms. You can also take measures to minimize your exposure to those allergens. °EXERCISE  °If you have exercise-induced asthma, or are planning vigorous exercise, or exercise in cold, humid, or dry environments, prevent exercise-induced asthma by following your caregiver's advice regarding asthma treatment before exercising. °Document Released: 12/06/2008 Document Revised: 12/07/2010 Document Reviewed: 12/06/2008 °[employer]® Patient Information ©2012 [employer].Asthma, Adult °Asthma is a disease of the lungs and can make it hard to breathe. Asthma cannot be cured, but medicine can help control it. Asthma may be started (triggered) by: °· Pollen.  °· Dust.  °· Animal skin flakes (dander).  °· Molds.  °· Foods.  °· Respiratory infections (colds, flu).  °· Smoke.  °· Exercise.  °· Stress.  °· Other things that cause  allergic reactions or allergies (allergens).  °HOME CARE  °· Talk to your doctor about how to manage your attacks at home. This may include:  °· Using a tool called a peak flow meter.  °· Having medicine ready to stop the attack.  °· Take all medicine as told by your doctor.  °· Wash bed sheets and blankets every week in hot water and put them in the dryer.  °· Drink enough fluids to keep your pee (urine) clear or pale yellow.  °· Always be ready to get emergency help. Write down the phone number for your doctor. Keep it where you can easily find it.  °· Talk about exercise routines with   your doctor.   If animal dander is causing your asthma, you may need to find a new home for your pet(s).  GET HELP RIGHT AWAY IF:   You have muscle aches.   You cough more.   You have chest pain.   You have thick spit (sputum) that changes to yellow, green, gray, or bloody.   Medicine does not stop your wheezing.   You have problems breathing.   You have a fever.   Your medicine causes:   A rash.   Itching.   Puffiness (swelling).   Breathing problems.  MAKE SURE YOU:   Understand these instructions.   Will watch your condition.    ill get help right away if you are not doing well or get worse.  Document Released: 06/06/2007 Document Revised: 12/07/2010 Document Reviewed: 10/29/2007   Please use your Advair twice per day. Used a over-the-counter antihistamine such as Claritin. Please speak to the pharmacist about generic antihistamines and alternatives to Claritin. Please use this on a regular basis. Please call Dr. Ilda Foil in this office for followup as soon as possible. [employer] Patient Information 2012 [place], [place].

## 2011-04-26 NOTE — ED Notes (Signed)
Patient is resting comfortably. 

## 2011-04-26 NOTE — ED Notes (Signed)
Patient here for a work note for today.

## 2011-05-21 IMAGING — CR DG CHEST 2V
1 series · 2 of 2 positions shown · non-contrast
Comparison: none

REASON FOR EXAM: cough
COMMENTS:

[Series 1: view not recorded · 0.17mm/px · 2 of 2 slices shown]
[im 1/2]
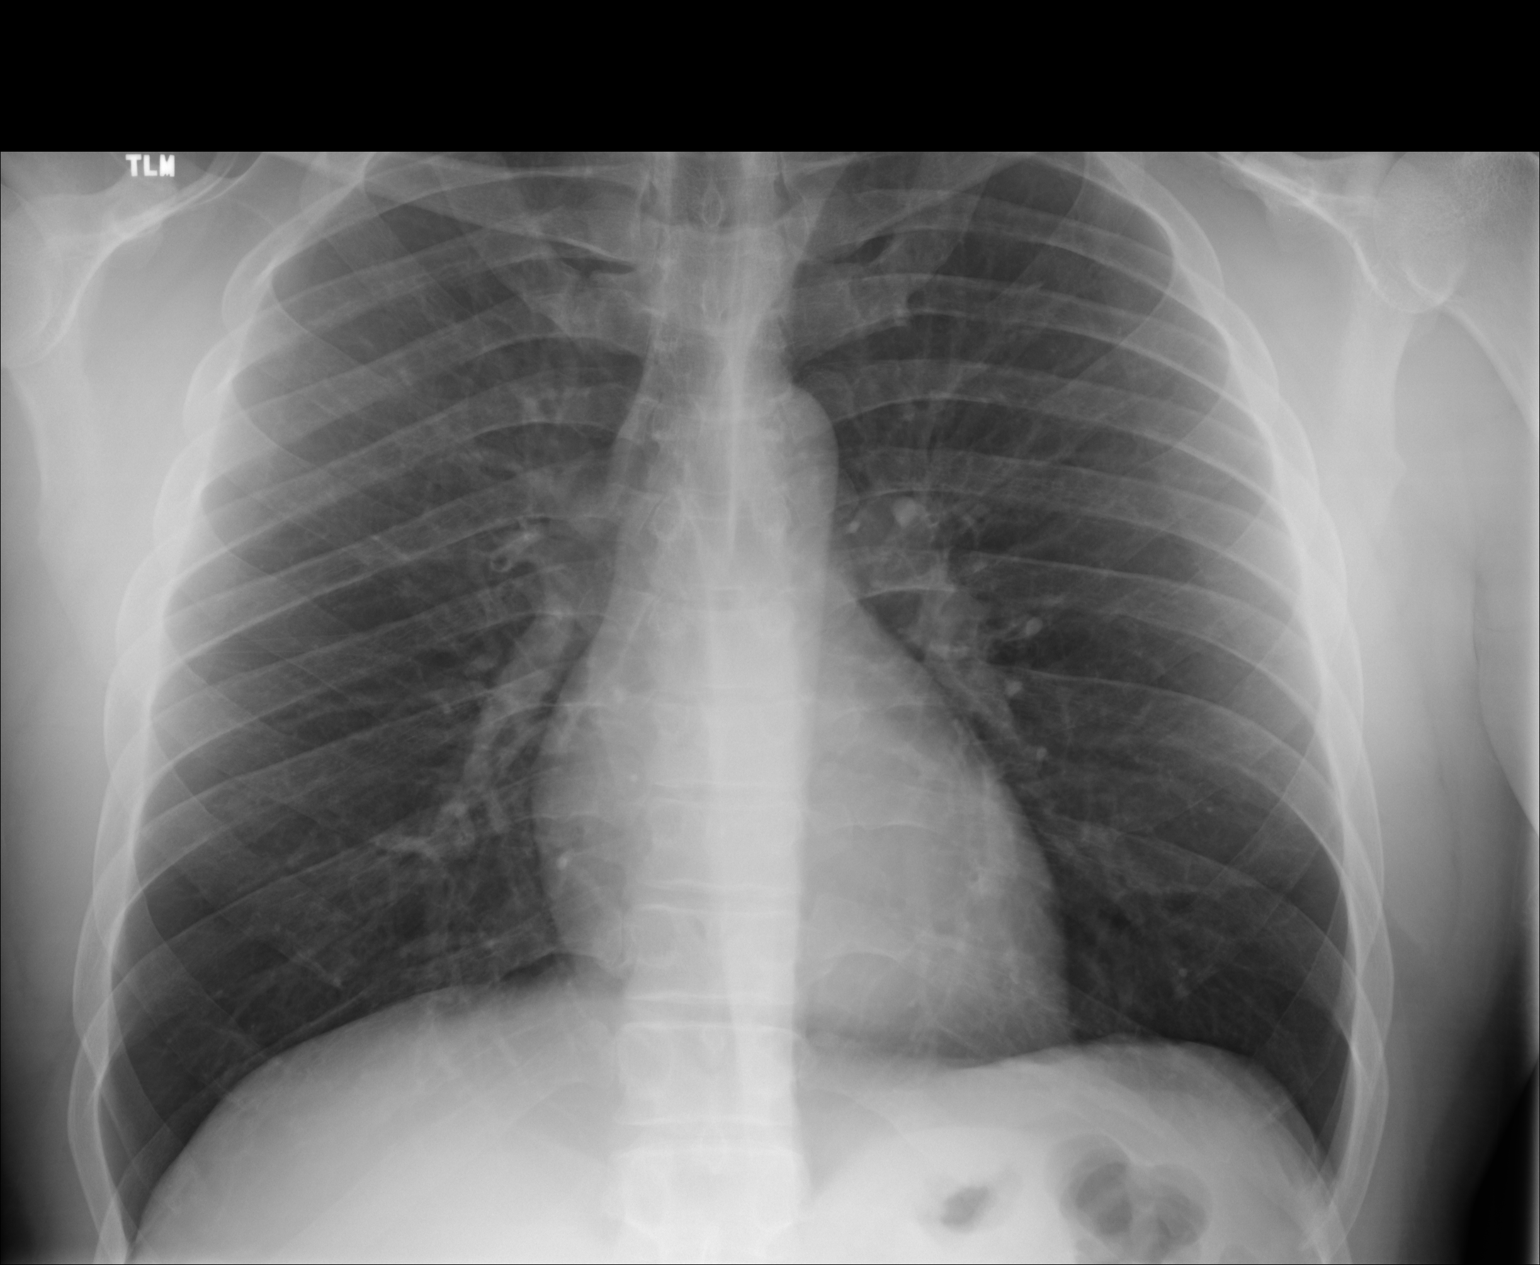
[im 2/2]
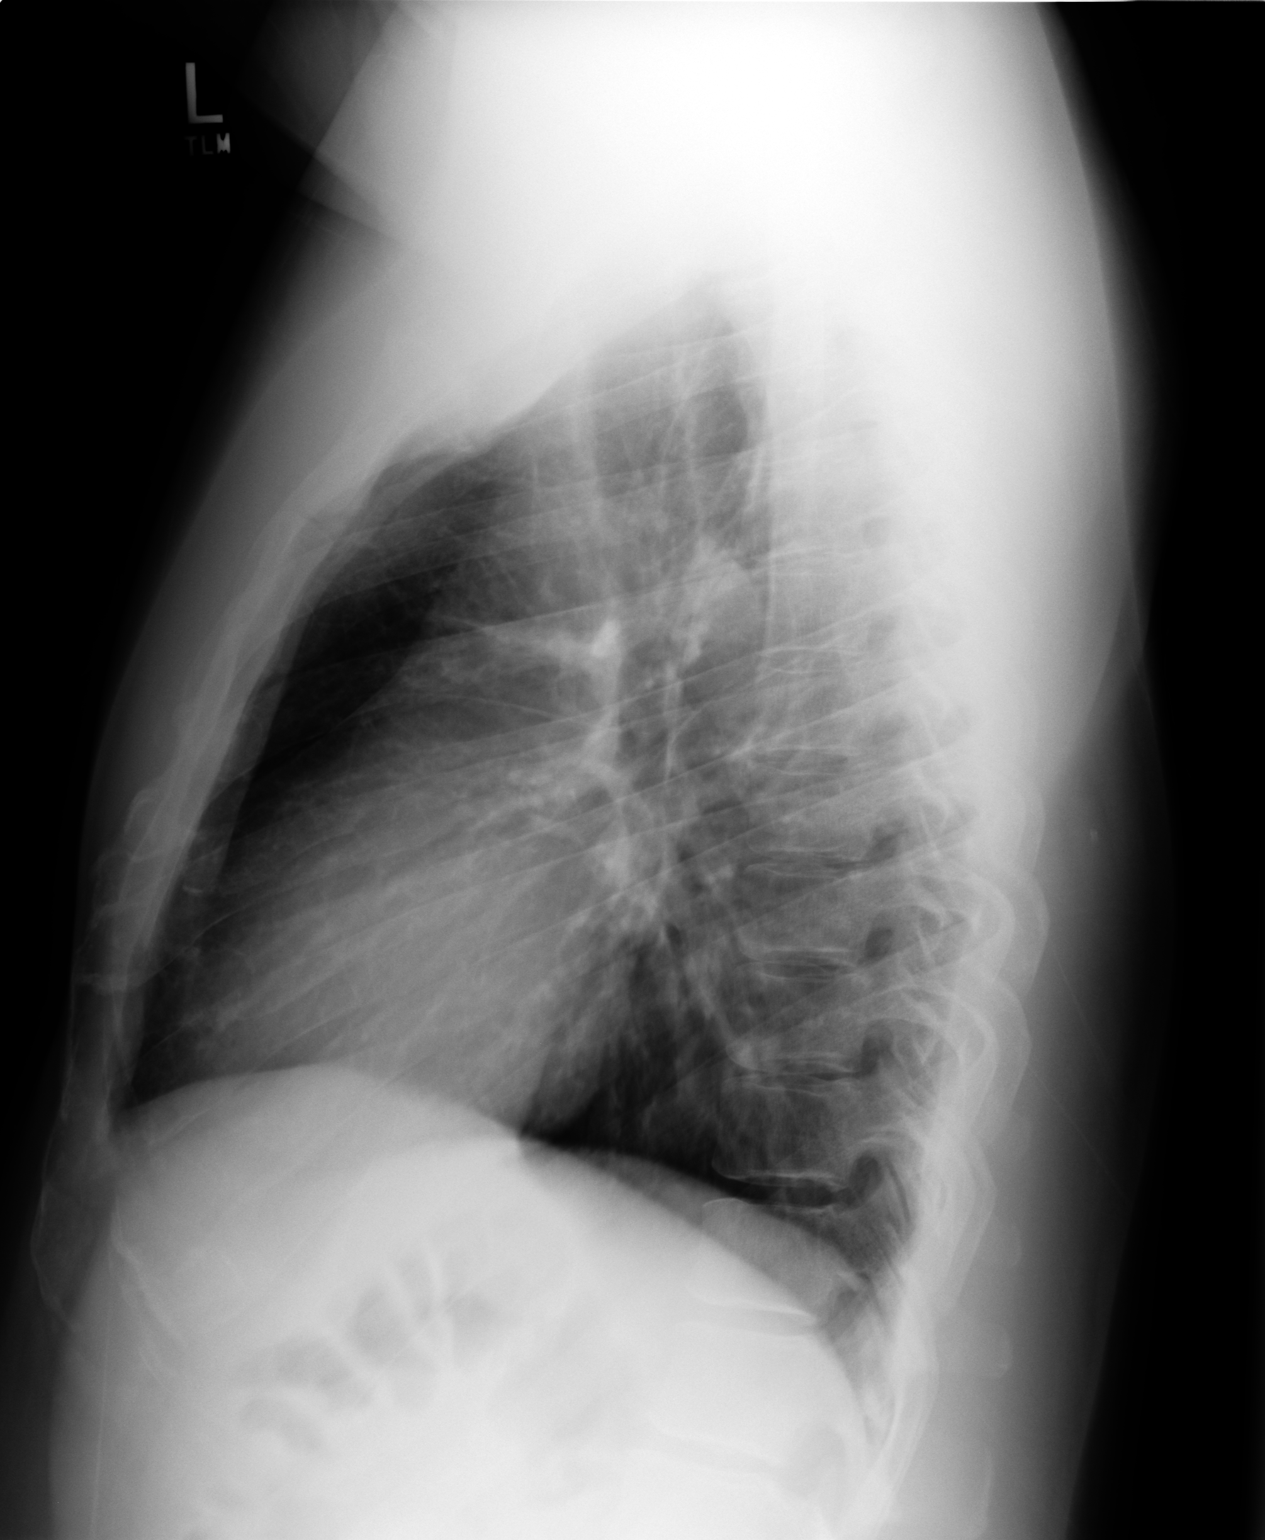

[2 of 2 positions shown; findings below may reference images not displayed]

PROCEDURE:     DXR - DXR CHEST PA (OR AP) AND LATERAL  - January 19, 2010 [DATE]

RESULT:     There is no previous exam for comparison. The lungs are clear.
The heart and pulmonary vessels are normal. The bony and mediastinal
structures are unremarkable. There is no effusion. There is no pneumothorax
or evidence of congestive failure.
IMPRESSION: No acute cardiopulmonary disease.

## 2012-05-01 ENCOUNTER — Emergency Department (HOSPITAL_COMMUNITY)
Admission: EM | Admit: 2012-05-01 | Discharge: 2012-05-01 | Disposition: A | Discharge status: Home / Self Care | Payer: Self-pay | Attending: Emergency Medicine | Admitting: Emergency Medicine

## 2012-05-01 ENCOUNTER — Encounter (HOSPITAL_COMMUNITY): Payer: Self-pay | Admitting: Emergency Medicine

## 2012-05-01 DIAGNOSIS — IMO0002 Reserved for concepts with insufficient information to code with codable children: Secondary | ICD-10-CM | POA: Insufficient documentation

## 2012-05-01 DIAGNOSIS — F17200 Nicotine dependence, unspecified, uncomplicated: Secondary | ICD-10-CM | POA: Insufficient documentation

## 2012-05-01 DIAGNOSIS — J45909 Unspecified asthma, uncomplicated: Secondary | ICD-10-CM | POA: Insufficient documentation

## 2012-05-01 DIAGNOSIS — H02826 Cysts of left eye, unspecified eyelid: Secondary | ICD-10-CM

## 2012-05-01 DIAGNOSIS — Z79899 Other long term (current) drug therapy: Secondary | ICD-10-CM | POA: Insufficient documentation

## 2012-05-01 DIAGNOSIS — H02829 Cysts of unspecified eye, unspecified eyelid: Secondary | ICD-10-CM | POA: Insufficient documentation

## 2012-05-01 DIAGNOSIS — H5710 Ocular pain, unspecified eye: Secondary | ICD-10-CM | POA: Insufficient documentation

## 2012-05-01 NOTE — ED Provider Notes (Signed)
Medical screening examination/treatment/procedure(s) were performed by non-physician practitioner and as supervising physician I was immediately available for consultation/collaboration.  Flint Melter, MD 05/01/12 [id number]

## 2012-05-01 NOTE — ED Notes (Signed)
Bump on left eyebrow that has been there for munths is now making his head hurt

## 2012-05-01 NOTE — ED Provider Notes (Signed)
History     CSN: [id number]  Arrival date & time 05/01/12  1201   First MD Initiated Contact with Patient 05/01/12 1208      No chief complaint on file.   (Consider location/radiation/quality/duration/timing/severity/associated sxs/prior treatment) HPI Comments: 37 year old male presents to the emergency department complaining of a "bump" near his left eyebrow that has been present for the past 3 months. Patient states the bump has slightly increased in size over the past 3 months and beginning to cause some pain in his left eye. States it is tender to touch. He has not tried any alleviating factors. He has not had this looked at in the past 3 months. Denies fever or chills.  The history is provided by the patient.    Past Medical History  Diagnosis Date  . Asthma     Past Surgical History  Procedure Laterality Date  . Left knee surery    . Left hand surgery      No family history on file.  History  Substance Use Topics  . Smoking status: Current Every Day Smoker    Last Attempt to Quit: 06/08/2010  . Smokeless tobacco: Not on file  . Alcohol Use: 0.0 oz/week    1-2 Cans of beer per week     Comment: daily      Review of Systems  Constitutional: Negative for fever and chills.  Eyes: Positive for pain. Negative for photophobia, discharge, redness, itching and visual disturbance.  Respiratory: Negative for shortness of breath.   Cardiovascular: Negative for chest pain.  All other systems reviewed and are negative.    Allergies  Review of patient's allergies indicates no known allergies.  Home Medications   Current Outpatient Rx  Name  Route  Sig  Dispense  Refill  . EXPIRED: albuterol (PROVENTIL HFA;VENTOLIN HFA) 108 (90 BASE) MCG/ACT inhaler   Inhalation   Inhale 1-2 puffs into the lungs every 6 (six) hours as needed for wheezing.   1 Inhaler   0   . EXPIRED: albuterol (PROVENTIL HFA;VENTOLIN HFA) 108 (90 BASE) MCG/ACT inhaler   Inhalation   Inhale 2  puffs into the lungs every 4 (four) hours as needed for wheezing.   1 Inhaler   0   . ALBUTEROL SULFATE IN   Inhalation   Inhale into the lungs as needed.           Marland Kitchen EXPIRED: fluticasone (FLOVENT HFA) 110 MCG/ACT inhaler   Inhalation   Inhale 2 puffs into the lungs 2 (two) times daily.   1 Inhaler   0   . predniSONE (DELTASONE) 10 MG tablet   Oral   Take 2 tablets (20 mg total) by mouth daily.   15 tablet   0     BP 125/87  Pulse 85  Temp(Src) 98.9 F (37.2 C)  Resp 16  SpO2 96%  Physical Exam  Nursing note and vitals reviewed. Constitutional: He is oriented to person, place, and time. He appears well-developed and well-nourished. No distress.  HENT:  Head: Normocephalic and atraumatic.  Mouth/Throat: Oropharynx is clear and moist.  Eyes: Conjunctivae, EOM and lids are normal. Pupils are equal, round, and reactive to light. No foreign bodies found. Right eye exhibits no discharge and no hordeolum. No foreign body present in the right eye. Left eye exhibits no discharge and no hordeolum. No foreign body present in the left eye.    Neck: Normal range of motion. Neck supple.  Cardiovascular: Normal rate, regular rhythm and normal heart  sounds.   Pulmonary/Chest: Effort normal and breath sounds normal.  Musculoskeletal: Normal range of motion. He exhibits no edema.  Neurological: He is alert and oriented to person, place, and time.  Skin: Skin is warm and dry. He is not diaphoretic.  Psychiatric: He has a normal mood and affect. His behavior is normal.    ED Course  Procedures (including critical care time)  Labs Reviewed - No data to display No results found.   1. Cyst, eyelid, left       MDM  37 year old male with cyst of left eyelid. This not consistent with hordeolum or chalazion.  Unable to visualize cyst upon inversion of eyelid. No redness or swelling. I discussed this case with Dr. Effie Shy who feels as if referral to ophthalmology is most appropriate  at this time. I referred patient to ophthalmology advised him to call immediately after leaving the emergency department. Patient states understanding of plan and is agreeable.        Trevor Mace, PA-C 05/01/12 1240

## 2016-09-25 LAB — HM HIV SCREENING LAB: HM HIV Screening: NEGATIVE

## 2018-07-28 ENCOUNTER — Ambulatory Visit: Payer: Self-pay

## 2018-08-01 ENCOUNTER — Ambulatory Visit: Payer: Self-pay

## 2018-10-07 ENCOUNTER — Other Ambulatory Visit: Payer: Self-pay

## 2018-10-07 ENCOUNTER — Encounter: Payer: Self-pay | Admitting: Family Medicine

## 2018-10-07 ENCOUNTER — Ambulatory Visit: Payer: Self-pay | Admitting: Family Medicine

## 2018-10-07 DIAGNOSIS — Z113 Encounter for screening for infections with a predominantly sexual mode of transmission: Secondary | ICD-10-CM

## 2018-10-07 LAB — GRAM STAIN

## 2018-10-07 NOTE — Progress Notes (Signed)
Gram stain reviewed and no treatment needed per standing order and per Hassell Done, FNP verbal order. Awaiting test results. Provider orders completed.Ronny Bacon, RN

## 2018-10-07 NOTE — Progress Notes (Signed)
    STI clinic/screening visit  Subjective:  Jorge Thomas is a 43 y.o. male being seen today for an STI screening visit. The patient reports they do not have symptoms.  Patient has the following medical conditions:  There are no active problems to display for this patient.    No chief complaint on file.   HPI  Patient reports no sympts.  States he received a text message that he needed to be checked for an STD  See flowsheet for further details and programmatic requirements.    The following portions of the patient's history were reviewed and updated as appropriate: allergies, current medications, past medical history, past social history, past surgical history and problem list.  Objective:  There were no vitals filed for this visit.  Physical Exam Constitutional:      Appearance: Normal appearance.  HENT:     Mouth/Throat:     Pharynx: Oropharynx is clear. No oropharyngeal exudate.  Neck:     Musculoskeletal: Neck supple. No muscular tenderness.  Genitourinary:    Penis: Normal.      Scrotum/Testes: Normal.  Lymphadenopathy:     Cervical: No cervical adenopathy.  Skin:    General: Skin is warm and dry.     Findings: No lesion or rash.  Neurological:     Mental Status: He is alert.    Assessment and Plan:  Jorge Thomas is a 43 y.o. male presenting to the [facility] Department for STI screening  1. Screening examination for venereal disease  - Gram stain-negative - Gonococcus culture  Co. To use condoms always for STD prevention   No follow-ups on file.  No future appointments.  Hassell Done, FNP

## 2018-10-12 LAB — GONOCOCCUS CULTURE

## 2020-12-29 ENCOUNTER — Ambulatory Visit: Payer: Self-pay

## 2021-08-17 ENCOUNTER — Ambulatory Visit
Admit: 2021-08-17 | Discharge: 2021-08-17 | Payer: PRIVATE HEALTH INSURANCE | Attending: Student in an Organized Health Care Education/Training Program | Primary: Student in an Organized Health Care Education/Training Program

## 2021-08-17 DIAGNOSIS — J4520 Mild intermittent asthma, uncomplicated: Secondary | ICD-10-CM
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
## 2021-08-17 NOTE — Progress Notes (Signed)
I reviewed with the resident the medical history and the resident's findings on the physical examination.  I discussed with the resident the patient's diagnosis and concur with the plan.

## 2021-08-18 LAB — CBC
Hematocrit: 46.4 % (ref 36.6–50.3)
Hemoglobin: 14.8 g/dL (ref 12.1–17.0)
MCH: 28.5 PG (ref 26.0–34.0)
MCHC: 31.9 g/dL (ref 30.0–36.5)
MCV: 89.2 FL (ref 80.0–99.0)
MPV: 11.1 FL (ref 8.9–12.9)
Nucleated RBCs: 0 PER 100 WBC
Platelets: 298 10*3/uL (ref 150–400)
RBC: 5.2 M/uL (ref 4.10–5.70)
RDW: 13.3 % (ref 11.5–14.5)
WBC: 5.6 10*3/uL (ref 4.1–11.1)
nRBC: 0 10*3/uL (ref 0.00–0.01)

## 2021-08-18 LAB — BASIC METABOLIC PANEL
Anion Gap: 4 mmol/L — ABNORMAL LOW (ref 5–15)
BUN: 20 MG/DL (ref 6–20)
Bun/Cre Ratio: 14 (ref 12–20)
CO2: 30 mmol/L (ref 21–32)
Calcium: 9.7 MG/DL (ref 8.5–10.1)
Chloride: 101 mmol/L (ref 97–108)
Creatinine: 1.42 MG/DL — ABNORMAL HIGH (ref 0.70–1.30)
Est, Glom Filt Rate: >60 mL/min/{1.73_m2} (ref >60)
Glucose: 80 mg/dL (ref 65–100)
Potassium: 5.4 mmol/L — ABNORMAL HIGH (ref 3.5–5.1)
Sodium: 135 mmol/L — ABNORMAL LOW (ref 136–145)

## 2021-08-18 LAB — LIPID PANEL
Chol/HDL Ratio: 6.6 — ABNORMAL HIGH (ref 0.0–5.0)
Cholesterol, Total: 172 MG/DL (ref <200)
HDL: 26 MG/DL
LDL Calculated: 118 MG/DL — ABNORMAL HIGH (ref 0–100)
Triglycerides: 140 MG/DL (ref <150)
VLDL Cholesterol Calculated: 28 MG/DL

## 2021-08-18 LAB — HEMOGLOBIN A1C
Hemoglobin A1C: 5.8 % — ABNORMAL HIGH (ref 4.0–5.6)
eAG: 120 mg/dL

## 2021-09-15 ENCOUNTER — Ambulatory Visit
Payer: PRIVATE HEALTH INSURANCE | Attending: Student in an Organized Health Care Education/Training Program | Primary: Student in an Organized Health Care Education/Training Program

## 2022-01-11 DIAGNOSIS — R7303 Prediabetes: Secondary | ICD-10-CM

## 2022-01-12 ENCOUNTER — Ambulatory Visit
Admit: 2022-01-12 | Discharge: 2022-01-12 | Payer: PRIVATE HEALTH INSURANCE | Attending: Student in an Organized Health Care Education/Training Program | Primary: Student in an Organized Health Care Education/Training Program

## 2022-01-12 DIAGNOSIS — R7303 Prediabetes: Secondary | ICD-10-CM
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
## 2022-01-12 NOTE — Progress Notes (Signed)
Chief Complaint   Patient presents with    Follow-up Chronic Condition     Patient need a renewal on the referral for the sleep study.      Vitals:    01/12/22 0854   BP: 138/86   Site: Right Upper Arm   Position: Sitting   Cuff Size: Large Adult   Pulse: 88   Resp: 18   Temp: 97.5 F (36.4 C)   TempSrc: Temporal   SpO2: 93%   Weight: 119.5 kg (263 lb 6.4 oz)   Height: 1.784 m (5' 10.25")     1. Have you been to the ER, urgent care clinic since your last visit?  Hospitalized since your last visit?No    2. Have you seen or consulted any other health care providers outside of the [place] since your last visit?  Include any pap smears or colon screening. No

## 2022-01-13 LAB — BASIC METABOLIC PANEL
Anion Gap: 7 mmol/L (ref 5–15)
BUN: 33 MG/DL — ABNORMAL HIGH (ref 6–20)
Bun/Cre Ratio: 24 — ABNORMAL HIGH (ref 12–20)
CO2: 26 mmol/L (ref 21–32)
Calcium: 9.5 MG/DL (ref 8.5–10.1)
Chloride: 106 mmol/L (ref 97–108)
Creatinine: 1.39 MG/DL — ABNORMAL HIGH (ref 0.70–1.30)
Est, Glom Filt Rate: >60 mL/min/{1.73_m2} (ref >60)
Glucose: 96 mg/dL (ref 65–100)
Potassium: 4.1 mmol/L (ref 3.5–5.1)
Sodium: 139 mmol/L (ref 136–145)

## 2022-01-13 LAB — MICROALBUMIN / CREATININE URINE RATIO
Creatinine, Ur: 92.8 mg/dL
Microalbumin Creatinine Ratio: 5 mg/g (ref 0–30)
Microalbumin, Random Urine: 0.51 MG/DL

## 2022-01-13 LAB — HEMOGLOBIN A1C
Hemoglobin A1C: 5.5 % (ref 4.0–5.6)
eAG: 111 mg/dL

## 2022-01-14 NOTE — Other (Signed)
BMP remarkable for elevated Cr, but improved from 1.42 to 1.39. Patient with prediabetes and elevated BP, so will plan to start patient on an Ace-I/ARB  A1c improved from 5.8 to 5.5  Microalb/Cr ratio wnl    Philip Medina, MD

## 2022-01-17 NOTE — Progress Notes (Signed)
I discussed the findings, assessment and plan with the resident and agree with the resident's findings and plan as documented in the resident's note.

## 2022-01-30 ENCOUNTER — Ambulatory Visit
Payer: PRIVATE HEALTH INSURANCE | Attending: Student in an Organized Health Care Education/Training Program | Primary: Student in an Organized Health Care Education/Training Program

## 2022-02-13 MED ORDER — ALBUTEROL SULFATE HFA 108 (90 BASE) MCG/ACT IN AERS
10890 (90 Base) MCG/ACT | RESPIRATORY_TRACT | 3 refills | Status: DC
Start: 2022-02-13 — End: 2022-03-12

## 2022-02-13 NOTE — Telephone Encounter (Signed)
From: Lamar Laundry  To: Alease Medina, MD  Sent: 02/12/2022 5:47 PM EST  Subject: Refill on my albuterol    Is there anyway you can prescribe a refill on my albuterol inhaler because I run out of refills next month! If you could I would highly appreciate this   Thank you

## 2022-02-13 NOTE — Telephone Encounter (Signed)
Medication Refill Request    Philip Morgan is requesting a refill of the following medication(s):   Requested Prescriptions     Pending Prescriptions Disp Refills    albuterol sulfate HFA (PROVENTIL;VENTOLIN;PROAIR) 108 (90 Base) MCG/ACT inhaler 18 g         Listed PCP is Alease Medina, MD   Last provider to prescribe medication: unknown  Last Date of Medication Prescribed: unknown  Date of Last Office Visit at [facility]: 01/12/22  FUTURE APPOINTMENT: No future appointments.    Please send refill to:    [id number] - [place], [place] - [address] - P [phone] [address] [place] [address]  Phone: [phone] Fax: [phone]      Please review request and approve or deny with recommendations.

## 2022-03-13 MED ORDER — ALBUTEROL SULFATE HFA 108 (90 BASE) MCG/ACT IN AERS
108 | RESPIRATORY_TRACT | 3 refills | Status: AC
Start: 2022-03-13 — End: ?

## 2022-09-22 ENCOUNTER — Inpatient Hospital Stay
Admission: AD | Admit: 2022-09-22 | Discharge: 2022-09-28 | DRG: 885 | Disposition: A | Discharge status: Home / Self Care | Payer: 59 | Source: Intra-hospital | Attending: Psychiatry | Admitting: Psychiatry

## 2022-09-22 ENCOUNTER — Ambulatory Visit (HOSPITAL_COMMUNITY)
Admission: EM | Admit: 2022-09-22 | Discharge: 2022-09-22 | Disposition: A | Discharge status: Other Acute Inpatient Hospital | Payer: No Payment, Other | Attending: Family Medicine | Admitting: Family Medicine

## 2022-09-22 DIAGNOSIS — Z560 Unemployment, unspecified: Secondary | ICD-10-CM

## 2022-09-22 DIAGNOSIS — F322 Major depressive disorder, single episode, severe without psychotic features: Secondary | ICD-10-CM | POA: Diagnosis not present

## 2022-09-22 DIAGNOSIS — G4700 Insomnia, unspecified: Secondary | ICD-10-CM | POA: Diagnosis present

## 2022-09-22 DIAGNOSIS — Z87891 Personal history of nicotine dependence: Secondary | ICD-10-CM

## 2022-09-22 DIAGNOSIS — F332 Major depressive disorder, recurrent severe without psychotic features: Secondary | ICD-10-CM | POA: Diagnosis present

## 2022-09-22 DIAGNOSIS — F1010 Alcohol abuse, uncomplicated: Secondary | ICD-10-CM | POA: Diagnosis present

## 2022-09-22 DIAGNOSIS — J45909 Unspecified asthma, uncomplicated: Secondary | ICD-10-CM | POA: Diagnosis present

## 2022-09-22 DIAGNOSIS — Z79899 Other long term (current) drug therapy: Secondary | ICD-10-CM

## 2022-09-22 DIAGNOSIS — F1410 Cocaine abuse, uncomplicated: Secondary | ICD-10-CM | POA: Diagnosis present

## 2022-09-22 DIAGNOSIS — R45851 Suicidal ideations: Secondary | ICD-10-CM | POA: Diagnosis present

## 2022-09-22 DIAGNOSIS — Z635 Disruption of family by separation and divorce: Secondary | ICD-10-CM

## 2022-09-22 DIAGNOSIS — F419 Anxiety disorder, unspecified: Secondary | ICD-10-CM | POA: Diagnosis present

## 2022-09-22 LAB — TSH: TSH: 1.507 u[IU]/mL (ref 0.350–4.500)

## 2022-09-22 LAB — COMPREHENSIVE METABOLIC PANEL
ALT: 48 U/L — ABNORMAL HIGH (ref 0–44)
AST: 38 U/L (ref 15–41)
Albumin: 4.5 g/dL (ref 3.5–5.0)
Alkaline Phosphatase: 53 U/L (ref 38–126)
Anion gap: 11 (ref 5–15)
BUN: 9 mg/dL (ref 6–20)
CO2: 28 mmol/L (ref 22–32)
Calcium: 9.8 mg/dL (ref 8.9–10.3)
Chloride: 99 mmol/L (ref 98–111)
Creatinine, Ser: 1.28 mg/dL — ABNORMAL HIGH (ref 0.61–1.24)
GFR, Estimated: >60 mL/min (ref >60)
Glucose, Bld: 82 mg/dL (ref 70–99)
Potassium: 4.2 mmol/L (ref 3.5–5.1)
Sodium: 138 mmol/L (ref 135–145)
Total Bilirubin: 0.9 mg/dL (ref 0.3–1.2)
Total Protein: 8.4 g/dL — ABNORMAL HIGH (ref 6.5–8.1)

## 2022-09-22 LAB — CBC WITH DIFFERENTIAL/PLATELET
Abs Immature Granulocytes: 0.02 10*3/uL (ref 0.00–0.07)
Basophils Absolute: 0.1 10*3/uL (ref 0.0–0.1)
Basophils Relative: 1 %
Eosinophils Absolute: 0.1 10*3/uL (ref 0.0–0.5)
Eosinophils Relative: 2 %
HCT: 53.2 % — ABNORMAL HIGH (ref 39.0–52.0)
Hemoglobin: 17.4 g/dL — ABNORMAL HIGH (ref 13.0–17.0)
Immature Granulocytes: 0 %
Lymphocytes Relative: 25 %
Lymphs Abs: 1.7 10*3/uL (ref 0.7–4.0)
MCH: 28.1 pg (ref 26.0–34.0)
MCHC: 32.7 g/dL (ref 30.0–36.0)
MCV: 85.9 fL (ref 80.0–100.0)
Monocytes Absolute: 0.5 10*3/uL (ref 0.1–1.0)
Monocytes Relative: 7 %
Neutro Abs: 4.3 10*3/uL (ref 1.7–7.7)
Neutrophils Relative %: 65 %
Platelets: 290 10*3/uL (ref 150–400)
RBC: 6.19 MIL/uL — ABNORMAL HIGH (ref 4.22–5.81)
RDW: 13.2 % (ref 11.5–15.5)
WBC: 6.7 10*3/uL (ref 4.0–10.5)
nRBC: 0 % (ref 0.0–0.2)

## 2022-09-22 LAB — POCT URINE DRUG SCREEN - MANUAL ENTRY (I-SCREEN)
POC Amphetamine UR: NOT DETECTED
POC Buprenorphine (BUP): NOT DETECTED
POC Cocaine UR: POSITIVE — AB
POC Marijuana UR: NOT DETECTED
POC Methadone UR: NOT DETECTED
POC Methamphetamine UR: NOT DETECTED
POC Morphine: NOT DETECTED
POC Oxazepam (BZO): NOT DETECTED
POC Oxycodone UR: NOT DETECTED
POC Secobarbital (BAR): NOT DETECTED

## 2022-09-22 MED ORDER — LORAZEPAM 1 MG PO TABS: 2.0000 mg | ORAL_TABLET | Freq: Three times a day (TID) | ORAL | Status: DC | PRN

## 2022-09-22 MED ORDER — THIAMINE HCL 100 MG/ML IJ SOLN: 100.0000 mg | Freq: Once | INTRAMUSCULAR | Status: DC

## 2022-09-22 MED ORDER — ALUM & MAG HYDROXIDE-SIMETH 200-200-20 MG/5ML PO SUSP: 30.0000 mL | ORAL | Status: DC | PRN

## 2022-09-22 MED ORDER — HALOPERIDOL LACTATE 5 MG/ML IJ SOLN: 5.0000 mg | Freq: Three times a day (TID) | INTRAMUSCULAR | Status: DC | PRN

## 2022-09-22 MED ORDER — MAGNESIUM HYDROXIDE 400 MG/5ML PO SUSP: 30.0000 mL | Freq: Every day | ORAL | Status: DC | PRN

## 2022-09-22 MED ORDER — LORAZEPAM 1 MG PO TABS: 1.0000 mg | ORAL_TABLET | Freq: Three times a day (TID) | ORAL | Status: DC

## 2022-09-22 MED ORDER — THIAMINE MONONITRATE 100 MG PO TABS: 100.0000 mg | ORAL_TABLET | Freq: Every day | ORAL | Status: DC

## 2022-09-22 MED ORDER — TRAZODONE HCL 50 MG PO TABS: 50.0000 mg | ORAL_TABLET | Freq: Every evening | ORAL | Status: DC | PRN

## 2022-09-22 MED ORDER — HYDROXYZINE HCL 25 MG PO TABS: 25.0000 mg | ORAL_TABLET | Freq: Four times a day (QID) | ORAL | Status: DC | PRN

## 2022-09-22 MED ORDER — ADULT MULTIVITAMIN W/MINERALS CH
1.0000 | ORAL_TABLET | Freq: Every day | ORAL | Status: DC
  Administered 2022-09-22: 1 via ORAL
  Filled 2022-09-22: qty 1

## 2022-09-22 MED ORDER — LORAZEPAM 1 MG PO TABS: 1.0000 mg | ORAL_TABLET | Freq: Every day | ORAL | Status: DC

## 2022-09-22 MED ORDER — LOPERAMIDE HCL 2 MG PO CAPS: 2.0000 mg | ORAL_CAPSULE | ORAL | Status: DC | PRN

## 2022-09-22 MED ORDER — LORAZEPAM 2 MG/ML IJ SOLN: 2.0000 mg | Freq: Three times a day (TID) | INTRAMUSCULAR | Status: DC | PRN

## 2022-09-22 MED ORDER — LORAZEPAM 1 MG PO TABS: 1.0000 mg | ORAL_TABLET | Freq: Two times a day (BID) | ORAL | Status: DC

## 2022-09-22 MED ORDER — ONDANSETRON 4 MG PO TBDP: 4.0000 mg | ORAL_TABLET | Freq: Four times a day (QID) | ORAL | Status: DC | PRN

## 2022-09-22 MED ORDER — HALOPERIDOL 5 MG PO TABS: 5.0000 mg | ORAL_TABLET | Freq: Three times a day (TID) | ORAL | Status: DC | PRN

## 2022-09-22 MED ORDER — ACETAMINOPHEN 325 MG PO TABS: 650.0000 mg | ORAL_TABLET | Freq: Four times a day (QID) | ORAL | Status: DC | PRN

## 2022-09-22 MED ORDER — ALBUTEROL SULFATE HFA 108 (90 BASE) MCG/ACT IN AERS
2.0000 | INHALATION_SPRAY | Freq: Four times a day (QID) | RESPIRATORY_TRACT | Status: DC | PRN
  Administered 2022-09-23 – 2022-09-26 (×6): 2 via RESPIRATORY_TRACT
  Filled 2022-09-22: qty 6.7

## 2022-09-22 MED ORDER — DIPHENHYDRAMINE HCL 50 MG PO CAPS: 50.0000 mg | ORAL_CAPSULE | Freq: Three times a day (TID) | ORAL | Status: DC | PRN

## 2022-09-22 MED ORDER — LORAZEPAM 1 MG PO TABS: 1.0000 mg | ORAL_TABLET | Freq: Four times a day (QID) | ORAL | Status: DC | PRN

## 2022-09-22 MED ORDER — DIPHENHYDRAMINE HCL 50 MG/ML IJ SOLN: 50.0000 mg | Freq: Three times a day (TID) | INTRAMUSCULAR | Status: DC | PRN

## 2022-09-22 MED ORDER — LORAZEPAM 1 MG PO TABS
1.0000 mg | ORAL_TABLET | Freq: Four times a day (QID) | ORAL | Status: DC
  Administered 2022-09-22 (×2): 1 mg via ORAL
  Filled 2022-09-22 (×2): qty 1

## 2022-09-22 NOTE — ED Provider Notes (Addendum)
[facility] Continuous Assessment Admission H&P  Date: 09/22/22 Patient Name: Jorge Thomas MRN: [id number] Chief Complaint:" I am in a bad headspace mentally I need help."   Diagnoses:  Final diagnoses:  Severe episode of recurrent major depressive disorder, without psychotic features [facility])    HPI: Jorge Thomas 47 year old African-American male presents accompanied by [facility] Department.  He is endorsing suicidal ideations denying plan or intent currently however, states he has been struggling with depression and suicidal thoughts since his recent separation between him and his wife.    He reports that he has been married for the past 3 years and has not been able to see his children.  Reports he currently resides alone.  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  Does admit to substance abuse with cocaine powder, marijuana and alcohol use.  Oneta Rack denied that he is currently followed by therapy or psychiatry.  States his last inpatient admission was a few years ago.  He denied that he is prescribed any psychotropic medications currently.  Patient presents tearful, reporting poor concentration, insomnia, mood irritability and increased depression.  During evaluation Memory Dance is sitting  he is alert/oriented x 3;  flat, guarded and depressed  patent is calm/cooperative; and mood congruent with affect.  Patient is speaking in a clear tone at moderate volume, and normal pace; with good eye contact.      His thought process is coherent and relevant; There is no indication that he is currently responding to internal/external stimuli or experiencing delusional thought content.  Patient denies suicidal/self-harm/homicidal ideation, psychosis, and paranoia.  Patient has remained calm throughout assessment and has answered questions appropriately.   Total Time spent with patient: 15 minutes  Musculoskeletal  Strength & Muscle Tone: within normal limits Gait &  Station: normal Patient leans: N/A  Psychiatric Specialty Exam  Presentation General Appearance: Appropriate for Environment  Eye Contact:Good  Speech:Clear and Coherent  Speech Volume:Normal  Handedness:Right   Mood and Affect  Mood:Anxious; Depressed  Affect:Congruent   Thought Process  Thought Processes:Coherent  Descriptions of Associations:Intact  Orientation:Full (Time, Place and Person)  Thought Content:Logical    Hallucinations:Hallucinations: None  Ideas of Reference:None  Suicidal Thoughts:Suicidal Thoughts: Yes, Passive SI Passive Intent and/or Plan: Without Intent  Homicidal Thoughts:Homicidal Thoughts: No   Sensorium  Memory:Immediate Good; Recent Poor  Judgment:Fair  Insight:Good   Executive Functions  Concentration:Good  Attention Span:Good  Recall:Fair  Fund of Knowledge:Good  Language:Good   Psychomotor Activity  Psychomotor Activity:Psychomotor Activity: Normal   Assets  Assets:Communication Skills; Financial Resources/Insurance; Desire for Improvement   Sleep  Sleep:Sleep: Fair   Nutritional Assessment (For OBS and FBC admissions only) Has the patient had a weight loss or gain of 10 pounds or more in the last 3 months?: No Has the patient had a decrease in food intake/or appetite?: No Does the patient have dental problems?: No Does the patient have eating habits or behaviors that may be indicators of an eating disorder including binging or inducing vomiting?: No Has the patient recently lost weight without trying?: 0 Has the patient been eating poorly because of a decreased appetite?: 0 Malnutrition Screening Tool Score: 0    Physical Exam Vitals and nursing note reviewed.  Constitutional:      Appearance: Normal appearance.  Cardiovascular:     Rate and Rhythm: Normal rate and regular rhythm.     Pulses: Normal pulses.     Heart sounds: Normal heart sounds.  Neurological:  Mental Status: He is alert  and oriented to person, place, and time.  Psychiatric:        Mood and Affect: Mood normal.        Thought Content: Thought content normal.    Review of Systems  Psychiatric/Behavioral:  Positive for substance abuse and suicidal ideas. Negative for depression. The patient is nervous/anxious.   All other systems reviewed and are negative.   Blood pressure 113/83, pulse 86, temperature 98.2 F (36.8 C), temperature source Oral, resp. rate 18, SpO2 96%. There is no height or weight on file to calculate BMI.  Past Psychiatric History:    Is the patient at risk to self? Yes  Has the patient been a risk to self in the past 6 months? Yes .    Has the patient been a risk to self within the distant past? No   Is the patient a risk to others? No   Has the patient been a risk to others in the past 6 months? No   Has the patient been a risk to others within the distant past? No   Past Medical History: See HPI  Family History: See HPI  Social History: See HPI  Last Labs:  No visits with results within 6 Month(s) from this visit.  Latest known visit with results is:  Office Visit on 10/07/2018  Component Date Value Ref Range Status   Gram Stain Result 10/07/2018 Comment:   Final   <2WBC/HPF;INTRACELLULAR GND ABSENT   GC Culture Only 10/07/2018 Final report   Final   Result 1 10/07/2018 Comment   Final   No Neisseria gonorrhoeae isolated.    Allergies: Patient has no known allergies.  Medications:  Facility Ordered Medications  Medication   acetaminophen (TYLENOL) tablet 650 mg   alum & mag hydroxide-simeth (MAALOX/MYLANTA) 200-200-20 MG/5ML suspension 30 mL   magnesium hydroxide (MILK OF MAGNESIA) suspension 30 mL   traZODone (DESYREL) tablet 50 mg   PTA Medications  Medication Sig   albuterol (PROVENTIL HFA;VENTOLIN HFA) 108 (90 BASE) MCG/ACT inhaler Inhale 2 puffs into the lungs every 6 (six) hours as needed for wheezing.      Medical Decision Making  Recommending  Inpatient  Start Zoloft 25 mg daily      Recommendations  Based on my evaluation the patient does not appear to have an emergency medical condition.  Oneta Rack, NP 09/22/22  1:27 PM

## 2022-09-22 NOTE — Progress Notes (Signed)
   09/22/22 1241  [facility] Triage Screening (Walk-ins at [facility] only)  How Did You Hear About [place]? Legal System  What Is the Reason for Your Visit/Call Today? Pt presents [facility] to [facility] via [facility]. Pt states that he's not in the best mental state. Pt states that he has been in a deep depression. Pt endorses SI at this present time, stating the thoughts started this morning and he is unsure if he would harm himself at this time. Pt denies HI, AVH and drug use at this current time. Pt states that he had unknown amounts of alcohol (beer & liquor) yesterday.  How Long Has This Been Causing You Problems? > than 6 months  Have You Recently Had Any Thoughts About Hurting Yourself? Yes  How long ago did you have thoughts about hurting yourself? today  Are You Planning to Commit Suicide/Harm Yourself At This time?  (Unsure)  Have you Recently Had Thoughts About Hurting Someone Karolee Ohs? No  Are You Planning To Harm Someone At This Time? No  Are you currently experiencing any auditory, visual or other hallucinations? No  Have You Used Any Alcohol or Drugs in the Past 24 Hours? Yes  How long ago did you use Drugs or Alcohol? yesterday  What Did You Use and How Much? unknown amounts of beer & liquor  Do you have any current medical co-morbidities that require immediate attention? No  Clinician description of patient physical appearance/behavior: unpleasant attitude, smells of alcohol  What Do You Feel Would Help You the Most Today? Treatment for Depression or other mood problem;Social Support  If access to [facility] was not available, would you have sought care in the Emergency Department? Yes  Determination of Need Emergent (2 hours)  Options For Referral Medication Management;Outpatient Therapy;Inpatient Hospitalization

## 2022-09-22 NOTE — BH Assessment (Signed)
Comprehensive Clinical Assessment (CCA) Note  09/22/2022 Jorge Thomas [id number]  DISPOSITION: Melvyn Neth NP recommends an inpatient admission to assist with stabilization.    The patient demonstrates the following risk factors for suicide: Chronic risk factors for suicide include: psychiatric disorder of depression . Acute risk factors for suicide include: family or marital conflict. Protective factors for this patient include: coping skills. Considering these factors, the overall suicide risk at this point appears to be moderate. Patient is not appropriate for outpatient follow up.   Patient is a 47 year old male that presents this date voluntary to [facility] brought in by [employer] reporting ongoing S/I although denies any plan or intent. Patient denies any H/I or AVH. Patient denies any previous attempts or gestures at self harm. Patient denies any previous mental health diagnosis and is currently not receiving any mental health OP services.   Patient states he recently relocated to the [place] area in July of this year after he and his wife of three years separated. Patient states they had been residing in [place] and he is now living with his cousins in this area. Patient is employed full time in transportation and delivery services but states he, "doesn't feel stable," reporting depressive symptoms which have worsened in the last month to include: feeling hopeless, racing thoughts, episodes of being tearful and getting, "lost in his thoughts." Patient states he also has only been getting 3 to 4 hours of sleep a night for the last month.  Patient reports he has three children and the stress of not being able to see them since they reside with his ex-wife is very difficult to deal with. Patient states he has been using alcohol and drugs to cope. Patient reports he uses up to 1 gram of powered cocaine 2 to 3 times a month and uses different amounts of alcohol 3 to 5 times a week with last use last night  when patient reported he, "had a few shots." Patient states he usually consumes about 1 pint of liquor in a week and usually "6 to 8" 12 oz beers. Patient denies any current withdrawals.  Patient denies any current legal issues or access to weapons. Patient is requesting assistance with ongoing mental health and SA issues before, "its to late."     Patient is alert and oriented x 5. Patient presents with a depressed mood with affect congruent. Patient is observed to be tearful at times but speaks in a normal voice with memory appearing to be intact. Patient's thoughts are organized and he is cooperative. Patient does not appear to be responding to internal stimuli.     Chief Complaint:  Chief Complaint  Patient presents with   Suicidal   Visit Diagnosis: MDD recurrent without psychotic features, severe, Alcohol abuse, Cocaine use     CCA Screening, Triage and Referral (STR)  Patient Reported Information How did you hear about [place]? Self  What Is the Reason for Your Visit/Call Today? Pt presents voluntarily to [facility] via [employer]. Pt states that he's not in the best mental state. Pt states that he has been in a deep depression. Pt endorses SI at this present time, stating the thoughts started this morning and he is unsure if he would harm himself at this time. Pt denies HI, AVH and drug use at this current time. Pt states that he had unknown amounts of alcohol (beer & liquor) yesterday.  How Long Has This Been Causing You Problems? 1 wk - 1 month  What  Do You Feel Would Help You the Most Today? Treatment for Depression or other mood problem; Alcohol or Drug Use Treatment   Have You Recently Had Any Thoughts About Hurting Yourself? Yes  Are You Planning to Commit Suicide/Harm Yourself At This time? No   Flowsheet Row ED from 09/22/2022 in [facility]  C-SSRS RISK CATEGORY Moderate Risk       Have you Recently Had Thoughts About Hurting Someone Karolee Ohs? No  Are You  Planning to Harm Someone at This Time? No  Explanation: Pt denies any thoughts to harm others   Have You Used Any Alcohol or Drugs in the Past 24 Hours? Yes  What Did You Use and How Much? Pt reports "having a couple shots" last night   Do You Currently Have a Therapist/Psychiatrist? No  Name of Therapist/Psychiatrist: Name of Therapist/Psychiatrist: NA   Have You Been Recently Discharged From Any Office Practice or Programs? No  Explanation of Discharge From Practice/Program: NA     CCA Screening Triage Referral Assessment Type of Contact: Face-to-Face  Telemedicine Service Delivery:   Is this Initial or Reassessment?   Date Telepsych consult ordered in [facility]:    Time Telepsych consult ordered in [facility]:    Location of Assessment: [facility]  Provider Location: [facility] Assessment Services   Collateral Involvement: None at this time   Does Patient Have a Automotive engineer Guardian? No  Legal Guardian Contact Information: NA  Copy of Legal Guardianship Form: -- (NA)  Legal Guardian Notified of Arrival: -- (NA)  Legal Guardian Notified of Pending Discharge: -- (NA)  If Minor and Not Living with Parent(s), Who has Custody? NA  Is CPS involved or ever been involved? Never  Is APS involved or ever been involved? Never   Patient Determined To Be At Risk for Harm To Self or Others Based on Review of Patient Reported Information or Presenting Complaint? Yes, for Self-Harm  Method: No Plan  Availability of Means: No access or NA  Intent: Vague intent or NA  Notification Required: -- (NA)  Additional Information for Danger to Others Potential: -- (NA)  Additional Comments for Danger to Others Potential: None noted  Are There Guns or Other Weapons in Your Home? No  Types of Guns/Weapons: Pt denies  Are These Weapons Safely Secured?                            -- (NA)  Who Could Verify You Are Able To Have These Secured: NA  Do You Have any  Outstanding Charges, Pending Court Dates, Parole/Probation? Pt denies  Contacted To Inform of Risk of Harm To Self or Others: Other: Comment (NA)    Does Patient Present under Involuntary Commitment? No    [place] of Residence: [place]   Patient Currently Receiving the Following Services: Not Receiving Services   Determination of Need: Urgent (48 hours)   Options For Referral: Inpatient Hospitalization     CCA Biopsychosocial Patient Reported Schizophrenia/Schizoaffective Diagnosis in Past: No   Strengths: Patient is willing to participate in treatment and states he needs help with his ongoing depression/anxiety   Mental Health Symptoms Depression:   Change in energy/activity; Irritability; Hopelessness   Duration of Depressive symptoms:  Duration of Depressive Symptoms: Greater than two weeks   Mania:   None   Anxiety:    Difficulty concentrating; Restlessness; Irritability   Psychosis:   None   Duration of  Psychotic symptoms:    Trauma:   None   Obsessions:   None   Compulsions:   None   Inattention:   None   Hyperactivity/Impulsivity:   None   Oppositional/Defiant Behaviors:   None   Emotional Irregularity:   Chronic feelings of emptiness   Other Mood/Personality Symptoms:   Pt states he is currently going through a divorce and has been "mentally suffering"    Mental Status Exam Appearance and self-care  Stature:   Average   Weight:   Average weight   Clothing:   Neat/clean   Grooming:   Normal   Cosmetic use:   None   Posture/gait:   Normal   Motor activity:   Not Remarkable   Sensorium  Attention:   Normal   Concentration:   Normal   Orientation:   X5   Recall/memory:   Normal   Affect and Mood  Affect:   Appropriate   Mood:   Anxious; Depressed   Relating  Eye contact:   Normal   Facial expression:   Anxious   Attitude toward examiner:   Cooperative   Thought and Language  Speech flow:   Clear and Coherent   Thought content:   Appropriate to Mood and Circumstances   Preoccupation:   None   Hallucinations:   None   Organization:   Goal-directed; Intact   [employer] of Knowledge:   Good   Intelligence:   Average   Abstraction:   Normal   Judgement:   Good   Reality Testing:   Realistic   Insight:   Good   Decision Making:   Normal   Social Functioning  Social Maturity:   Responsible   Social Judgement:   Normal   Stress  Stressors:   Transitions; Family conflict; Relationship   Coping Ability:   Overwhelmed   Skill Deficits:   Decision making   Supports:   Friends/Service system     Religion: Religion/Spirituality Are You A Religious Person?: No How Might This Affect Treatment?: NA  Leisure/Recreation: Leisure / Recreation Do You Have Hobbies?: No  Exercise/Diet: Exercise/Diet Do You Exercise?: No Have You Gained or Lost A Significant Amount of Weight in the Past Six Months?: No Do You Follow a Special Diet?: No Explanation of Sleeping Difficulties: Pt states he has only been sleeping 3 to 4 hours a night for the last month   CCA Employment/Education Employment/Work Situation: Employment / Work Situation Employment Situation: Employed Work Stressors: Pt states he often has rasing thoughts while at work Patient's Job has Been Impacted by Current Illness: No Has Patient ever Been in Equities trader?: No  Education: Education Is Patient Currently Attending School?: No Last Grade Completed: 12 Did You Product manager?: No Did You Have An Individualized Education Program (IIEP): No Did You Have Any Difficulty At [employer]?: No Patient's Education Has Been Impacted by Current Illness: No   CCA Family/Childhood History Family and Relationship History: Family history Marital status: Separated Separated, when?: 4 months ago What types of issues is patient dealing with in the relationship?: Pt states he  is currently going through a difficult seperation Additional relationship information: None noted Does patient have children?: Yes How many children?: 3 How is patient's relationship with their children?: Pt reports a good relationship with his children  Childhood History:  Childhood History By whom was/is the patient raised?: Both parents Did patient suffer any verbal/emotional/physical/sexual abuse as a child?: No Did patient suffer from severe childhood neglect?:  No Has patient ever been sexually abused/assaulted/raped as an adolescent or adult?: No Was the patient ever a victim of a crime or a disaster?: No Witnessed domestic violence?: No Has patient been affected by domestic violence as an adult?: No       CCA Substance Use Alcohol/Drug Use: Alcohol / Drug Use Pain Medications: See MAR Prescriptions: See MAR Over the Counter: See MAR History of alcohol / drug use?: Yes Longest period of sobriety (when/how long): 1 year Negative Consequences of Use: Personal relationships Withdrawal Symptoms: None Substance #1 Name of Substance 1: Alcohol 1 - Age of First Use: 19 1 - Amount (size/oz): Pt states he uses about 1 pint of alcohol (liquor) every 3 to 5 days 1 - Frequency: 3 to 5 times a week 1 - Duration: Ongoing for the last year 1 - Last Use / Amount: Pt states he "had a couple shots" of liquor last night 1 - Method of Aquiring: Legally 1- Route of Use: Oral Substance #2 Name of Substance 2: Cocaine 2 - Age of First Use: 37 2 - Amount (size/oz): Pt states he uses less than a half a gram 2 - Frequency: 1 to 2 times a month 2 - Duration: Ongoing for the last year 2 - Last Use / Amount: Pt states 3 days ago when he used about one quarter of a fram 2 - Method of Aquiring: Illegal 2 - Route of Substance Use: Snorting                     ASAM's:  Six Dimensions of Multidimensional Assessment  Dimension 1:  Acute Intoxication and/or Withdrawal Potential:    Dimension 1:  Description of individual's past and current experiences of substance use and withdrawal: Fully functioning  Dimension 2:  Biomedical Conditions and Complications:   Dimension 2:  Description of patient's biomedical conditions and  complications: Fully able to cope with any physical discomfort  Dimension 3:  Emotional, Behavioral, or Cognitive Conditions and Complications:  Dimension 3:  Description of emotional, behavioral, or cognitive conditions and complications: Good impulse control  Dimension 4:  Readiness to Change:  Dimension 4:  Description of Readiness to Change criteria: Willing to enter treatment to address ongoing SA issues  Dimension 5:  Relapse, Continued use, or Continued Problem Potential:  Dimension 5:  Relapse, continued use, or continued problem potential critiera description: Minimal relapse potential  Dimension 6:  Recovery/Living Environment:  Dimension 6:  Recovery/Iiving environment criteria description: Has a supportive recovery environment  ASAM Severity Score: ASAM's Severity Rating Score: 1  ASAM Recommended Level of Treatment: ASAM Recommended Level of Treatment: Level II Intensive Outpatient Treatment   Substance use Disorder (SUD) Substance Use Disorder (SUD)  Checklist Symptoms of Substance Use: Continued use despite having a persistent/recurrent physical/psychological problem caused/exacerbated by use  Recommendations for Services/Supports/Treatments: Recommendations for Services/Supports/Treatments Recommendations For Services/Supports/Treatments: Inpatient Hospitalization  Discharge Disposition:    DSM5 Diagnoses: There are no problems to display for this patient.    Referrals to Alternative Service(s): Referred to Alternative Service(s):   Place:   Date:   Time:    Referred to Alternative Service(s):   Place:   Date:   Time:    Referred to Alternative Service(s):   Place:   Date:   Time:    Referred to Alternative Service(s):   Place:    Date:   Time:     Alfredia Ferguson, LCAS

## 2022-09-22 NOTE — ED Notes (Signed)
Report given to [facility] RN ARM-C bed is a ready bed

## 2022-09-22 NOTE — ED Notes (Signed)
Patient given lunch and brought on the unit, currently watching TV, patient endorses some SI, no plan, states, "I am in a bad place mentally right now," denies HI/AVH. Contracts for safety. Oriented pt to unit. Pt agreeable to go inpatient.

## 2022-09-22 NOTE — Progress Notes (Signed)
Pt was accepted to [facility] TODAY  09/22/2022; Bed Assignment-To be assigned by [facility] Charge RN. Please ensure patient's voluntary consent is uploaded prior to transfer.  Address: [address] [place], [place], [place] [address]  BMU FAX Number [phone]  DX:MDD, recurrent.   Pt meets inpatient criteria per Tilford Pillar  Attending Physician will be Dr. Marval Regal.  Report can be called to: [phone]  Pt can arrive after: 9:30pm  Per [facility] AC,Provider, please include admission orders and agitation protocol. Pt. is pre-admitted.   Care Team notified: Day [facility] AC Antoinette [place], RN,Tanika Lewis,NP, Rozann Lesches [place], [place] [facility] Alcario Drought Margit Banda  Maryjean Ka, MSW, [place] 09/22/2022 6:10 PM

## 2022-09-22 NOTE — Progress Notes (Addendum)
Pt is UNDER REVIEW at [facility] BMU TODAY 09/22/2022;Bed Assignment will be provided by [facility] AC and/ Charge BMU RN PENDING review of cbc,cmp and ths and EKG.  Address: [address] [place], [place], [place] [address]  Lenon Oms Number [phone]  Pt meets inpatient criteria per Ephriam Jenkins  Attending Physician will be Dr. Shellee Milo  Report can be called to: [phone]  Pt can arrive after: 9:00pm PENDING Completion/ review of PENDING items.  Care Team notified: Day [facility] AC Antoinette [place], RN,Tanika Luisa Dago  Maryjean Ka, MSW, [facility] 09/22/2022 3:04 PM      Are you all going to order a CBC or CMP?

## 2022-09-22 NOTE — ED Notes (Signed)
Attempted to call report nurse not available at this time.

## 2022-09-22 NOTE — ED Notes (Signed)
Safe transport called to transport pt to [facility]

## 2022-09-23 ENCOUNTER — Encounter: Payer: Self-pay | Admitting: Psychiatry

## 2022-09-23 ENCOUNTER — Other Ambulatory Visit: Payer: Self-pay

## 2022-09-23 LAB — ETHANOL: Alcohol, Ethyl (B): <10 mg/dL (ref <10)

## 2022-09-23 MED ORDER — TRAZODONE HCL 50 MG PO TABS
50.0000 mg | ORAL_TABLET | Freq: Every day | ORAL | Status: DC
  Administered 2022-09-23 – 2022-09-27 (×5): 50 mg via ORAL
  Filled 2022-09-23 (×5): qty 1

## 2022-09-23 MED ORDER — ZIPRASIDONE MESYLATE 20 MG IM SOLR: 20.0000 mg | INTRAMUSCULAR | Status: DC | PRN

## 2022-09-23 MED ORDER — LORAZEPAM 1 MG PO TABS: 1.0000 mg | ORAL_TABLET | ORAL | Status: DC | PRN

## 2022-09-23 MED ORDER — OLANZAPINE 5 MG PO TBDP: 5.0000 mg | ORAL_TABLET | Freq: Three times a day (TID) | ORAL | Status: DC | PRN

## 2022-09-23 MED ORDER — BENZTROPINE MESYLATE 1 MG PO TABS: 1.0000 mg | ORAL_TABLET | Freq: Four times a day (QID) | ORAL | Status: DC | PRN

## 2022-09-23 MED ORDER — ESCITALOPRAM OXALATE 10 MG PO TABS
5.0000 mg | ORAL_TABLET | Freq: Every day | ORAL | Status: DC
  Administered 2022-09-23 – 2022-09-24 (×2): 5 mg via ORAL
  Filled 2022-09-23 (×2): qty 1

## 2022-09-23 MED ORDER — GABAPENTIN 100 MG PO CAPS
100.0000 mg | ORAL_CAPSULE | Freq: Two times a day (BID) | ORAL | Status: DC
  Administered 2022-09-23 – 2022-09-25 (×4): 100 mg via ORAL
  Filled 2022-09-23 (×4): qty 1

## 2022-09-23 MED ORDER — HALOPERIDOL 5 MG PO TABS
5.0000 mg | ORAL_TABLET | Freq: Four times a day (QID) | ORAL | Status: DC | PRN
  Administered 2022-09-28: 5 mg via ORAL

## 2022-09-23 NOTE — Tx Team (Signed)
Initial Treatment Plan 09/23/2022 5:54 AM Memory Dance [id number]    PATIENT STRESSORS: Marital or family conflict   Substance abuse     PATIENT STRENGTHS: Ability for insight  Motivation for treatment/growth    PATIENT IDENTIFIED PROBLEMS: Suicidal ideation     Depression     Suicidal ideation              DISCHARGE CRITERIA:  Adequate post-discharge living arrangements Improved stabilization in mood, thinking, and/or behavior  PRELIMINARY DISCHARGE PLAN: Outpatient therapy Participate in family therapy  PATIENT/FAMILY INVOLVEMENT: This treatment plan has been presented to and reviewed with the patient, Jorge Thomas, The patient and family have been given the opportunity to ask questions and make suggestions.  Trula Ore, RN 09/23/2022, 5:54 AM

## 2022-09-23 NOTE — Group Note (Signed)
Date:  09/23/2022 Time:  2:20 PM  Group Topic/Focus:  Self Care- Focus ; To help enhance our well-being and maintain good mental health. Bec oming aware of how often, or how well, we practice self care activities can help [place] identify areas we are neglecting and improve upon them for better mental health. Group community meeting Outdoor recreation structure group.    Participation Level:  Minimal  Participation Quality:  Appropriate  Affect:  Appropriate  Cognitive:  Appropriate  Insight: Limited  Engagement in Group:  Limited  Modes of Intervention:  Activity  Additional Comments:    Jorge Thomas 09/23/2022, 2:20 PM

## 2022-09-23 NOTE — Plan of Care (Signed)
  Problem: Education: Goal: Knowledge of General Education information will improve Description: Including pain rating scale, medication(s)/side effects and non-pharmacologic comfort measures Outcome: Progressing   Problem: Health Behavior/Discharge Planning: Goal: Ability to manage health-related needs will improve Outcome: Progressing   Problem: Clinical Measurements: Goal: Ability to maintain clinical measurements within normal limits will improve Outcome: Progressing Goal: Will remain free from infection Outcome: Progressing Goal: Diagnostic test results will improve Outcome: Progressing Goal: Respiratory complications will improve Outcome: Progressing Goal: Cardiovascular complication will be avoided Outcome: Progressing   Problem: Activity: Goal: Risk for activity intolerance will decrease Outcome: Progressing   Problem: Nutrition: Goal: Adequate nutrition will be maintained Outcome: Progressing   Problem: Coping: Goal: Level of anxiety will decrease Outcome: Progressing   Problem: Elimination: Goal: Will not experience complications related to bowel motility Outcome: Progressing Goal: Will not experience complications related to urinary retention Outcome: Progressing   Problem: Pain Managment: Goal: General experience of comfort will improve Outcome: Progressing   Problem: Safety: Goal: Ability to remain free from injury will improve Outcome: Progressing   Problem: Skin Integrity: Goal: Risk for impaired skin integrity will decrease Outcome: Progressing   Problem: Education: Goal: Knowledge of [facility] Education information/materials will improve Outcome: Progressing Goal: Emotional status will improve Outcome: Progressing Goal: Mental status will improve Outcome: Progressing Goal: Verbalization of understanding the information provided will improve Outcome: Progressing   Problem: Activity: Goal: Interest or engagement in activities will  improve Outcome: Progressing Goal: Sleeping patterns will improve Outcome: Progressing   Problem: Coping: Goal: Ability to verbalize frustrations and anger appropriately will improve Outcome: Progressing Goal: Ability to demonstrate self-control will improve Outcome: Progressing   Problem: Health Behavior/Discharge Planning: Goal: Identification of resources available to assist in meeting health care needs will improve Outcome: Progressing Goal: Compliance with treatment plan for underlying cause of condition will improve Outcome: Progressing   Problem: Physical Regulation: Goal: Ability to maintain clinical measurements within normal limits will improve Outcome: Progressing   Problem: Safety: Goal: Periods of time without injury will increase Outcome: Progressing   

## 2022-09-23 NOTE — Group Note (Signed)
Date:  09/23/2022 Time:  5:51 PM  Group Topic/Focus:  Structured group activity ; Art therapy/movie group therapy.    Participation Level:  Minimal  Participation Quality:  Attentive  Affect:  Appropriate  Cognitive:  Alert and Appropriate  Insight: Appropriate  Engagement in Group:  Engaged  Modes of Intervention:  Activity  Additional Comments:    Darleen Moffitt 09/23/2022, 5:51 PM

## 2022-09-23 NOTE — [facility] Suicide Risk Assessment (Signed)
[facility] Suicide Risk Assessment   Nursing information obtained from:  Patient Demographic factors:  Male Current Mental Status:  NA Loss Factors:  Loss of significant relationship Historical Factors:  Impulsivity Risk Reduction Factors:  Responsible for children under 45 years of age, Employed, Religious beliefs about death  Total Time spent with patient: 3 hours Principal Problem: <principal problem not specified> Diagnosis:  Active Problems:   * No active hospital problems. *  Subjective Data: 47 year old [place] American male presents stating, "I have hurt so many people" and "I haven't seen my kids in months."The patient is legally separated and unemployed. Reports sniffing cocaine and drinking alcohol, with the last intake of alcohol being yesterday evening.The patient states, "I came here for help" and begins crying during the interview, covering his face.Expresses suicidal ideation with a plan but refuses to elaborate. Denies homicidal ideation. The patient has not been on medication and requests something for sleep.Reports anxiety at 8/10.Patient is tearful, emotionally distressed, covering his face during parts of the interview.Denies homicidal ideation. Does not report hallucinations/delusions.Patient does not have any head or hand tremors a this time. Patient's mood appears anxious, with an 8/10 rating of anxiety. CIWA protocol initiated to monitor for signs of alcohol withdrawal, with agitation protocol in place. ETOH lab work pending. Chart and nursing notes reviewed.Marland Kitchen   CLINICAL FACTORS:   Depression:   Hopelessness Insomnia Alcohol/Substance Abuse/Dependencies Unstable or Poor Therapeutic Relationship   Musculoskeletal: Strength & Muscle Tone: within normal limits Gait & Station: normal Patient leans: N/A  Psychiatric Specialty Exam:  Presentation  General Appearance:  Casual  Eye Contact: None; Poor (put arms over face)  Speech: Blocked  Speech  Volume: Decreased  Handedness: Right   Mood and Affect  Mood: Anxious; Depressed  Affect: Depressed; Restricted   Thought Process  Thought Processes: Coherent  Descriptions of Associations:Intact  Orientation:Full (Time, Place and Person)  Thought Content:WDL  History of Schizophrenia/Schizoaffective disorder:No  Duration of Psychotic Symptoms:No data recorded Hallucinations:Hallucinations: None  Ideas of Reference:None  Suicidal Thoughts:Suicidal Thoughts: Yes, Active SI Active Intent and/or Plan: Without Means to Carry Out SI Passive Intent and/or Plan: Without Means to Carry Out  Homicidal Thoughts:Homicidal Thoughts: No   Sensorium  Memory: Immediate Good  Judgment: Poor  Insight: Poor   Executive Functions  Concentration: Good  Attention Span: Fair  Recall: Fair  Fund of Knowledge: Good  Language: Good   Psychomotor Activity  Psychomotor Activity: Psychomotor Activity: Normal   Assets  Assets: Desire for Improvement (" I want to get better")   Sleep  Sleep: Sleep: Fair Number of Hours of Sleep: 4    Physical Exam: Physical Exam Vitals and nursing note reviewed.  Constitutional:      Appearance: Normal appearance.  HENT:     Head: Normocephalic and atraumatic.     Nose: Nose normal.  Pulmonary:     Effort: Pulmonary effort is normal.  Musculoskeletal:        General: Normal range of motion.     Cervical back: Normal range of motion.  Neurological:     General: No focal deficit present.     Mental Status: He is alert and oriented to person, place, and time.    Review of Systems  Psychiatric/Behavioral:  Positive for substance abuse. The patient is nervous/anxious and has insomnia.    Blood pressure 118/81, pulse 79, temperature 97.8 F (36.6 C), temperature source Temporal, resp. rate 16, height 5\' 11"  (1.803 m), weight 92.1 kg, SpO2 92%. Body mass index  is 28.31 kg/m.   COGNITIVE FEATURES THAT CONTRIBUTE  TO RISK:  None    SUICIDE RISK:   Moderate:  Frequent suicidal ideation with limited intensity, and duration, some specificity in terms of plans, no associated intent, good self-control, limited dysphoria/symptomatology, some risk factors present, and identifiable protective factors, including available and accessible social support.  PLAN OF CARE:  1.Continue to monitor for further symptoms of withdrawal, including seizures, anxiety, and changes in mental status. 2. Initiate Trazodone 50 mg at night for sleep. 3. Start Lexapro 5 mg daily for depression and anxiety. 4. Initiate Gabapentin 100 mg BID to prevent seizures related to alcohol intake. 5. Continue monitoring for ETOH withdrawal, agitation, anxiety, and seizures using CIWA protocol. 6. Lab work pending for alcohol level 7.Educated patient on the prescribed medication regimen and the impact of alcohol and cocaine use. 8. Continue to work with LCSW for stabilization and discharge   I certify that inpatient services furnished can reasonably be expected to improve the patient's condition.   Myriam Forehand, NP 09/23/2022, 1:49 PM

## 2022-09-23 NOTE — Progress Notes (Signed)
Admission Note:  60 yr AA male with past medical hx of  Major depression who presents voluntary commitment for  suicidal ideation, depression and substance abuse. Upon arrival on the unit patient is noted anxious,he expressed he needs help for alcoholism and he stated " things are really bad"  Patient's affect is flat and sad, he was calm and cooperative with admission process. Patient currently denies SI/HI/AVH and contracts for safety upon admission. Patient expressed worsening depression, marital discord and substance abuse that has spiraled out of control; he was offered emotional support and encouragement.     Patient was skin was assessed and found warm, dry and intact.  In addition patient was searched and no contraband found, plan of care explained, orientation to unit with unit policies explained and understanding verbalized. Patient was offered food and fluids he accepted it. 15 minutes safety checks maintained, will continue to monitor.

## 2022-09-23 NOTE — Plan of Care (Signed)
Denies SI / HI /AVH. No signs of distress or injury. Pt is without issue, isolative to room. Pt is adherent with scheduled medications. Staff will continue to monitor for safety q15.    Problem: Education: Goal: Knowledge of General Education information will improve Description: Including pain rating scale, medication(s)/side effects and non-pharmacologic comfort measures Outcome: Progressing   Problem: Health Behavior/Discharge Planning: Goal: Ability to manage health-related needs will improve Outcome: Progressing   Problem: Clinical Measurements: Goal: Ability to maintain clinical measurements within normal limits will improve Outcome: Progressing

## 2022-09-23 NOTE — H&P (Signed)
Psychiatric Admission Assessment Adult  Patient Identification: Jorge Thomas MRN:  [id number] Date of Evaluation:  09/23/2022 Chief Complaint:  MDD Principal Diagnosis: <principal problem not specified> Diagnosis:  Active Problems:   * No active hospital problems. *  History of Present Illness: 47 year old [place] American male presents stating, "I have hurt so many people" and "I haven't seen my kids in months."The patient is legally separated and unemployed. Reports sniffing cocaine and drinking alcohol, with the last intake of alcohol being yesterday evening.The patient states, "I came here for help" and begins crying during the interview, covering his face.Expresses suicidal ideation with a plan but refuses to elaborate. Denies homicidal ideation. The patient has not been on medication and requests something for sleep.Reports anxiety at 8/10.Patient is tearful, emotionally distressed, covering his face during parts of the interview.Denies homicidal ideation. Does not report hallucinations/delusions.Patient does not have any head or hand tremors a this time. Patient's mood appears anxious, with an 8/10 rating of anxiety.CIWA protocol initiated to monitor for signs of alcohol withdrawal, with agitation protocol in place.ETOH lab work pending. Chart and nursing notes reviewe  Associated Signs/Symptoms: Depression Symptoms:  feelings of worthlessness/guilt, difficulty concentrating, hopelessness, suicidal thoughts without plan, anxiety, loss of energy/fatigue, disturbed sleep, (Hypo) Manic Symptoms:  Elevated Mood, Irritable Mood, Anxiety Symptoms:  Excessive Worry, Psychotic Symptoms:   NONE PTSD Symptoms: Negative Total Time spent with patient: 2.5 hours  Past Psychiatric History: SUBSTANCE ABUSE  Is the patient at risk to self? Yes.    Has the patient been a risk to self in the past 6 months? Yes.    Has the patient been a risk to self within the distant past? Yes.    Is the  patient a risk to others? No.  Has the patient been a risk to others in the past 6 months? No.  Has the patient been a risk to others within the distant past? No.   [place] Scale:  Flowsheet Row Admission (Current) from 09/22/2022 in [facility] INPATIENT BEH[facility] MEDICINE Most recent reading at 09/23/2022  5:00 AM ED from 09/22/2022 in [facility] Most recent reading at 09/22/2022 12:52 PM  C-SSRS RISK CATEGORY No Risk Moderate Risk        Prior Inpatient Therapy: No. If yes, describe none reported  Prior Outpatient Therapy: No. If yes, describe none reported   Alcohol Screening: 1. How often do you have a drink containing alcohol?: 4 or more times a week 2. How many drinks containing alcohol do you have on a typical day when you are drinking?: 3 or 4 3. How often do you have six or more drinks on one occasion?: Weekly AUDIT-C Score: 8 4. How often during the last year have you found that you were not able to stop drinking once you had started?: Never 5. How often during the last year have you failed to do what was normally expected from you because of drinking?: Never 6. How often during the last year have you needed a first drink in the morning to get yourself going after a heavy drinking session?: Never 7. How often during the last year have you had a feeling of guilt of remorse after drinking?: Never 8. How often during the last year have you been unable to remember what happened the night before because you had been drinking?: Never 9. Have you or someone else been injured as a result of your drinking?: No 10. Has a relative or friend or a doctor or another  health worker been concerned about your drinking or suggested you cut down?: No Alcohol Use Disorder Identification Test Final Score (AUDIT): 8 Alcohol Brief Interventions/Follow-up: Alcohol education/Brief advice Substance Abuse History in the last 12 months:  Yes.   Consequences of Substance  Abuse: Family Consequences:  separated has not seen children DT's: none at this time Withdrawal Symptoms:   None Previous Psychotropic Medications: No  Psychological Evaluations: No  Past Medical History:  Past Medical History:  Diagnosis Date   Asthma     Past Surgical History:  Procedure Laterality Date   left hand surgery     left knee surery     Family History: History reviewed. No pertinent family history. Family Psychiatric  History: none reported Tobacco Screening:  Social History   Tobacco Use  Smoking Status Former   Current packs/day: 0.00   Types: Cigarettes   Quit date: 06/08/2010   Years since quitting: 12.3  Smokeless Tobacco Never    BH Tobacco Counseling     Are you interested in Tobacco Cessation Medications?  No value filed. Counseled patient on smoking cessation:  No value filed. Reason Tobacco Screening Not Completed: No value filed.       Social History:  Social History   Substance and Sexual Activity  Alcohol Use Yes   Alcohol/week: 1.0 - 2.0 standard drink of alcohol   Types: 1 - 2 Cans of beer per week   Comment: daily     Social History   Substance and Sexual Activity  Drug Use No    Additional Social History:                           Allergies:  No Known Allergies Lab Results:  Results for orders placed or performed during the hospital encounter of 09/22/22 (from the past 48 hour(s))  CBC with Differential/Platelet     Status: Abnormal   Collection Time: 09/22/22  1:43 PM  Result Value Ref Range   WBC 6.7 4.0 - 10.5 K/uL   RBC 6.19 (H) 4.22 - 5.81 MIL/uL   Hemoglobin 17.4 (H) 13.0 - 17.0 g/dL   HCT 52.8 (H) 41.3 - 24.4 %   MCV 85.9 80.0 - 100.0 fL   MCH 28.1 26.0 - 34.0 pg   MCHC 32.7 30.0 - 36.0 g/dL   RDW 01.0 27.2 - 53.6 %   Platelets 290 150 - 400 K/uL   nRBC 0.0 0.0 - 0.2 %   Neutrophils Relative % 65 %   Neutro Abs 4.3 1.7 - 7.7 K/uL   Lymphocytes Relative 25 %   Lymphs Abs 1.7 0.7 - 4.0 K/uL    Monocytes Relative 7 %   Monocytes Absolute 0.5 0.1 - 1.0 K/uL   Eosinophils Relative 2 %   Eosinophils Absolute 0.1 0.0 - 0.5 K/uL   Basophils Relative 1 %   Basophils Absolute 0.1 0.0 - 0.1 K/uL   Immature Granulocytes 0 %   Abs Immature Granulocytes 0.02 0.00 - 0.07 K/uL    Comment: Performed at [facility], [address], [place], [place] [address]  Comprehensive metabolic panel     Status: Abnormal   Collection Time: 09/22/22  1:43 PM  Result Value Ref Range   Sodium 138 135 - 145 mmol/L   Potassium 4.2 3.5 - 5.1 mmol/L   Chloride 99 98 - 111 mmol/L   CO2 28 22 - 32 mmol/L   Glucose, Bld 82 70 - 99 mg/dL  Comment: Glucose reference range applies only to samples taken after fasting for at least 8 hours.   BUN 9 6 - 20 mg/dL   Creatinine, Ser 1.61 (H) 0.61 - 1.24 mg/dL   Calcium 9.8 8.9 - 09.6 mg/dL   Total Protein 8.4 (H) 6.5 - 8.1 g/dL   Albumin 4.5 3.5 - 5.0 g/dL   AST 38 15 - 41 U/L   ALT 48 (H) 0 - 44 U/L   Alkaline Phosphatase 53 38 - 126 U/L   Total Bilirubin 0.9 0.3 - 1.2 mg/dL   GFR, Estimated >04 >54 mL/min    Comment: (NOTE) Calculated using the CKD-EPI Creatinine Equation (2021)    Anion gap 11 5 - 15    Comment: Performed at [facility], [address] [place], [place] [address]  TSH     Status: None   Collection Time: 09/22/22  1:43 PM  Result Value Ref Range   TSH 1.507 0.350 - 4.500 uIU/mL    Comment: Performed by a 3rd Generation assay with a functional sensitivity of <=0.01 uIU/mL. Performed at [facility], [address] [place], [place] [address]   POCT Urine Drug Screen - (I-Screen)     Status: Abnormal   Collection Time: 09/22/22  2:22 PM  Result Value Ref Range   POC Amphetamine UR None Detected NONE DETECTED (Cut Off Level 1000 ng/mL)   POC Secobarbital (BAR) None Detected NONE DETECTED (Cut Off Level 300 ng/mL)   POC Buprenorphine (BUP) None Detected NONE DETECTED (Cut Off Level 10 ng/mL)   POC Oxazepam (BZO) None  Detected NONE DETECTED (Cut Off Level 300 ng/mL)   POC Cocaine UR Positive (A) NONE DETECTED (Cut Off Level 300 ng/mL)   POC Methamphetamine UR None Detected NONE DETECTED (Cut Off Level 1000 ng/mL)   POC Morphine None Detected NONE DETECTED (Cut Off Level 300 ng/mL)   POC Methadone UR None Detected NONE DETECTED (Cut Off Level 300 ng/mL)   POC Oxycodone UR None Detected NONE DETECTED (Cut Off Level 100 ng/mL)   POC Marijuana UR None Detected NONE DETECTED (Cut Off Level 50 ng/mL)    Blood Alcohol level:  pending   Current Medications: Current Facility-Administered Medications  Medication Dose Route Frequency Provider Last Rate Last Admin   acetaminophen (TYLENOL) tablet 650 mg  650 mg Oral Q6H PRN Bing Neighbors, NP       albuterol (VENTOLIN HFA) 108 (90 Base) MCG/ACT inhaler 2 puff  2 puff Inhalation Q6H PRN Bing Neighbors, NP   2 puff at 09/23/22 0800   alum & mag hydroxide-simeth (MAALOX/MYLANTA) 200-200-20 MG/5ML suspension 30 mL  30 mL Oral Q4H PRN Bing Neighbors, NP       haloperidol (HALDOL) tablet 5 mg  5 mg Oral Q6H PRN Myriam Forehand, NP       And   benztropine (COGENTIN) tablet 1 mg  1 mg Oral Q6H PRN Myriam Forehand, NP       escitalopram (LEXAPRO) tablet 5 mg  5 mg Oral Daily Myriam Forehand, NP       gabapentin (NEURONTIN) capsule 100 mg  100 mg Oral BID Myriam Forehand, NP       OLANZapine zydis (ZYPREXA) disintegrating tablet 5 mg  5 mg Oral Q8H PRN Myriam Forehand, NP       And   LORazepam (ATIVAN) tablet 1 mg  1 mg Oral PRN Myriam Forehand, NP  And   ziprasidone (GEODON) injection 20 mg  20 mg Intramuscular PRN Myriam Forehand, NP       magnesium hydroxide (MILK OF MAGNESIA) suspension 30 mL  30 mL Oral Daily PRN Bing Neighbors, NP       traZODone (DESYREL) tablet 50 mg  50 mg Oral QHS Myriam Forehand, NP       PTA Medications: Medications Prior to Admission  Medication Sig Dispense Refill Last Dose   albuterol (PROVENTIL HFA;VENTOLIN HFA) 108 (90 BASE)  MCG/ACT inhaler Inhale 2 puffs into the lungs every 6 (six) hours as needed for wheezing.       Musculoskeletal: Strength & Muscle Tone: within normal limits Gait & Station: normal Patient leans: N/A            Psychiatric Specialty Exam:  Presentation  General Appearance:  Casual  Eye Contact: None; Poor (put arms over face)  Speech: Blocked  Speech Volume: Decreased  Handedness: Right   Mood and Affect  Mood: Anxious; Depressed  Affect: Depressed; Restricted   Thought Process  Thought Processes: Coherent  Duration of Psychotic Symptoms: 30 days Past Diagnosis of Schizophrenia or Psychoactive disorder: No  Descriptions of Associations:Intact  Orientation:Full (Time, Place and Person)  Thought Content:WDL  Hallucinations:Hallucinations: None  Ideas of Reference:None  Suicidal Thoughts:Suicidal Thoughts: Yes, Active SI Active Intent and/or Plan: Without Means to Carry Out SI Passive Intent and/or Plan: Without Means to Carry Out  Homicidal Thoughts:Homicidal Thoughts: No   Sensorium  Memory: Immediate Good  Judgment: Poor  Insight: Poor   Executive Functions  Concentration: Good  Attention Span: Fair  Recall: Fair  Fund of Knowledge: Good  Language: Good   Psychomotor Activity  Psychomotor Activity: Psychomotor Activity: Normal   Assets  Assets: Desire for Improvement (" I want to get better")   Sleep  Sleep: Sleep: Fair Number of Hours of Sleep: 4    Physical Exam: Physical Exam Vitals and nursing note reviewed.  Constitutional:      Appearance: Normal appearance.  HENT:     Head: Normocephalic and atraumatic.     Nose: Nose normal.  Pulmonary:     Effort: Pulmonary effort is normal.  Musculoskeletal:        General: Normal range of motion.     Cervical back: Normal range of motion.  Neurological:     General: No focal deficit present.     Mental Status: He is alert and oriented to person,  place, and time.    Review of Systems  Psychiatric/Behavioral:  Positive for substance abuse and suicidal ideas. The patient is nervous/anxious and has insomnia.   All other systems reviewed and are negative.  Blood pressure 118/81, pulse 79, temperature 97.8 F (36.6 C), temperature source Temporal, resp. rate 16, height 5\' 11"  (1.803 m), weight 92.1 kg, SpO2 92%. Body mass index is 28.31 kg/m.  Treatment Plan Summary: Daily contact with patient to assess and evaluate symptoms and progress in treatment and Medication management 1.Continue to monitor for further symptoms of withdrawal, including seizures, anxiety, and changes in mental status. 2. Initiate Trazodone 50 mg at night for sleep. 3. Start Lexapro 5 mg daily for depression and anxiety. 4. Initiate Gabapentin 100 mg BID to prevent seizures related to alcohol intake. 5. Continue monitoring for ETOH withdrawal, agitation, anxiety, and seizures using CIWA protocol. 6. Lab work pending for alcohol level 7.Educated patient on the prescribed medication regimen and the impact of alcohol and cocaine use. 8. Continue to work  with LCSW for stabilization and discharge   Observation Level/Precautions:  Continuous Observation Detox 15 minute checks Seizure  Laboratory:   ETOH  Psychotherapy:    Medications:    Consultations:    Discharge Concerns:    Estimated LOS:  Other:     Physician Treatment Plan for Primary Diagnosis: <principal problem not specified> Long Term Goal(s): Improvement in symptoms so as ready for discharge  Short Term Goals: Ability to identify changes in lifestyle to reduce recurrence of condition will improve, Ability to verbalize feelings will improve, Ability to disclose and discuss suicidal ideas, Ability to demonstrate self-control will improve, Ability to identify and develop effective coping behaviors will improve, Ability to maintain clinical measurements within normal limits will improve, Compliance with  prescribed medications will improve, and Ability to identify triggers associated with substance abuse/mental health issues will improve  Physician Treatment Plan for Secondary Diagnosis: Active Problems:   * No active hospital problems. *  Long Term Goal(s): Improvement in symptoms so as ready for discharge  Short Term Goals: Ability to identify changes in lifestyle to reduce recurrence of condition will improve, Ability to verbalize feelings will improve, Ability to disclose and discuss suicidal ideas, Ability to demonstrate self-control will improve, Ability to identify and develop effective coping behaviors will improve, Ability to maintain clinical measurements within normal limits will improve, Compliance with prescribed medications will improve, and Ability to identify triggers associated with substance abuse/mental health issues will improve  I certify that inpatient services furnished can reasonably be expected to improve the patient's condition.    Myriam Forehand, NP 9/22/20242:31 PM

## 2022-09-23 NOTE — Group Note (Unsigned)
Date:  09/23/2022 Time:  10:49 PM  Group Topic/Focus:  Rediscovering Joy:   The focus of this group is to explore various ways to relieve stress in a positive manner.     Participation Level:  {B[facility] PARTICIPATION GBTDV:76160}  Participation Quality:  {[facility] PARTICIPATION QUALITY:22265}  Affect:  {[facility] AFFECT:22266}  Cognitive:  {[facility] COGNITIVE:22267}  Insight: {BHH Insight2:20797}  Engagement in Group:  {B[facility] ENGAGEMENT IN VPXTG:62694}  Modes of Intervention:  {B[facility] MODES OF INTERVENTION:22269}  Additional Comments:  ***  Ignazio Kincaid 09/23/2022, 10:49 PM

## 2022-09-23 NOTE — Discharge Instructions (Signed)
Accpted to the BMU

## 2022-09-23 NOTE — Progress Notes (Signed)
Pt reported SOB w/ wheezing. No inhaler in drawer. Confirmed with pt that he had personal inhaler in locker with belonging. Confirmed with provider on site. Pt self administered and s/s of asthma were reduced. Will send message to pharmacy that inhaler is not in drawer.

## 2022-09-24 DIAGNOSIS — F322 Major depressive disorder, single episode, severe without psychotic features: Secondary | ICD-10-CM | POA: Diagnosis not present

## 2022-09-24 MED ORDER — HYDROXYZINE HCL 50 MG PO TABS
50.0000 mg | ORAL_TABLET | Freq: Four times a day (QID) | ORAL | Status: DC | PRN
  Administered 2022-09-24 – 2022-09-27 (×7): 50 mg via ORAL
  Filled 2022-09-24 (×9): qty 1

## 2022-09-24 MED ORDER — ESCITALOPRAM OXALATE 10 MG PO TABS
10.0000 mg | ORAL_TABLET | Freq: Every day | ORAL | Status: DC
  Administered 2022-09-25 – 2022-09-28 (×4): 10 mg via ORAL
  Filled 2022-09-24 (×4): qty 1

## 2022-09-24 NOTE — Progress Notes (Signed)
Patient was cooperative with treatment on the shift.  He denies SI, HI, & AVH. No new issues to report on shift at this time.

## 2022-09-24 NOTE — Group Note (Signed)
Date:  09/24/2022 Time:  6:43 PM  Group Topic/Focus:  the use of time outdoors in a manner designed for therapeutic refreshment of one's body or mind.  Music Therapy and Outdoor Recreation.    Participation Level:  Did Not Attend   Jorge Thomas 09/24/2022, 6:43 PM

## 2022-09-24 NOTE — Plan of Care (Signed)
Problem: Coping: Goal: Level of anxiety will decrease Outcome: Progressing   Problem: Safety: Goal: Ability to remain free from injury will improve Outcome: Progressing   Problem: Education: Goal: Mental status will improve Outcome: Progressing   Problem: Coping: Goal: Ability to verbalize frustrations and anger appropriately will improve Outcome: Progressing   Problem: Safety: Goal: Periods of time without injury will increase Outcome: Progressing

## 2022-09-24 NOTE — Progress Notes (Signed)
[facility] MD Progress Note  09/24/2022  Jorge Thomas  MRN:  [id number]  Patient is 47 year old African American male presents stating, "I have hurt so many people" and "I haven't seen my kids in months."The patient is legally separated and unemployed.   Subjective: Chart reviewed, case discussed in multidisciplinary treatment team meeting today, patient seen during rounds.  Patient continues to feel depressed.  He said" I have hurt so many people my wife, my kids, my parents, my aunt and uncle.  I stole from them, I lied to them".  Patient feels remorseful, ashamed, and has guilt.  Patient was provided with support and reassurance.  We discussed coping strategies.  Patient was encouraged to keep a journal of his thoughts and work on it.  Patient was also encouraged to talk to staff on the unit if he is overwhelmed with suicide thoughts.  We discussed human nature, and that at the end of the day we all are humans, we all at some point in life make mistakes and errors.  Patient has taken the first step to admit his mistakes and is remorseful.  We discussed that at some point the patient will ask the family for forgiveness Patient was receptive to interventions.  He has thoughts of harming himself but denies any intention or plan to harm himself on the unit.  He denies homicidal ideations or psychotic symptoms.  Principal Problem: MDD (major depressive disorder), single episode, severe , no psychosis (HCC) Diagnosis: Principal Problem:   MDD (major depressive disorder), single episode, severe , no psychosis (HCC)    Past Psychiatric History: None reported by the patient  Past Medical History:  Past Medical History:  Diagnosis Date   Asthma     Past Surgical History:  Procedure Laterality Date   left hand surgery     left knee surery     Family History: History reviewed. No pertinent family history.  Social History:  Social History   Substance and Sexual Activity  Alcohol Use Yes    Alcohol/week: 1.0 - 2.0 standard drink of alcohol   Types: 1 - 2 Cans of beer per week   Comment: daily     Social History   Substance and Sexual Activity  Drug Use No    Social History   Socioeconomic History   Marital status: Single    Spouse name: Not on file   Number of children: Not on file   Years of education: Not on file   Highest education level: Not on file  Occupational History   Not on file  Tobacco Use   Smoking status: Former    Current packs/day: 0.00    Types: Cigarettes    Quit date: 06/08/2010    Years since quitting: 12.3   Smokeless tobacco: Never  Substance and Sexual Activity   Alcohol use: Yes    Alcohol/week: 1.0 - 2.0 standard drink of alcohol    Types: 1 - 2 Cans of beer per week    Comment: daily   Drug use: No   Sexual activity: Not on file  Other Topics Concern   Not on file  Social History Narrative   Not on file   Social Determinants of Health   Financial Resource Strain: Not on file  Food Insecurity: No Food Insecurity (09/23/2022)   Hunger Vital Sign    Worried About Running Out of Food in the Last Year: Never true    Ran Out of Food in the Last Year: Never true  Transportation Needs: No Transportation Needs (09/23/2022)   PRAPARE - Administrator, Civil Service (Medical): No    Lack of Transportation (Non-Medical): No  Physical Activity: Not on file  Stress: Not on file  Social Connections: Not on file   Additional Social History:                         Sleep: Poor  Appetite:  Poor  Current Medications: Current Facility-Administered Medications  Medication Dose Route Frequency Provider Last Rate Last Admin   acetaminophen (TYLENOL) tablet 650 mg  650 mg Oral Q6H PRN Bing Neighbors, NP       albuterol (VENTOLIN HFA) 108 (90 Base) MCG/ACT inhaler 2 puff  2 puff Inhalation Q6H PRN Bing Neighbors, NP   2 puff at 09/24/22 0841   alum & mag hydroxide-simeth (MAALOX/MYLANTA) 200-200-20 MG/5ML  suspension 30 mL  30 mL Oral Q4H PRN Bing Neighbors, NP       haloperidol (HALDOL) tablet 5 mg  5 mg Oral Q6H PRN Myriam Forehand, NP       And   benztropine (COGENTIN) tablet 1 mg  1 mg Oral Q6H PRN Myriam Forehand, NP       escitalopram (LEXAPRO) tablet 5 mg  5 mg Oral Daily Myriam Forehand, NP   5 mg at 09/24/22 5621   gabapentin (NEURONTIN) capsule 100 mg  100 mg Oral BID Myriam Forehand, NP   100 mg at 09/24/22 3086   hydrOXYzine (ATARAX) tablet 50 mg  50 mg Oral Q6H PRN Lewanda Rife, MD       OLANZapine zydis (ZYPREXA) disintegrating tablet 5 mg  5 mg Oral Q8H PRN Myriam Forehand, NP       And   LORazepam (ATIVAN) tablet 1 mg  1 mg Oral PRN Myriam Forehand, NP       And   ziprasidone (GEODON) injection 20 mg  20 mg Intramuscular PRN Myriam Forehand, NP       magnesium hydroxide (MILK OF MAGNESIA) suspension 30 mL  30 mL Oral Daily PRN Bing Neighbors, NP       traZODone (DESYREL) tablet 50 mg  50 mg Oral QHS Myriam Forehand, NP   50 mg at 09/23/22 2126    Lab Results:  Results for orders placed or performed during the hospital encounter of 09/22/22 (from the past 48 hour(s))  Ethanol     Status: None   Collection Time: 09/23/22  2:59 PM  Result Value Ref Range   Alcohol, Ethyl (B) <10 <10 mg/dL    Comment: (NOTE) Lowest detectable limit for serum alcohol is 10 mg/dL.  For medical purposes only. Performed at [facility], [address] [place], [place] [address]     Blood Alcohol level:  Lab Results  Component Value Date   [facility] <10 09/23/2022    Metabolic Disorder Labs: No results found for: "HGBA1C", "MPG" No results found for: "PROLACTIN" No results found for: "CHOL", "TRIG", "HDL", "CHOLHDL", "VLDL", "LDLCALC"  Physical Findings: AIMS:  , ,  ,  ,    CIWA:  CIWA-Ar Total: 1 COWS:     Musculoskeletal: Strength & Muscle Tone: within normal limits Gait & Station: normal Patient leans: N/A  Psychiatric Specialty Exam:  Presentation  General Appearance:   Casual  Eye Contact: Limited  Speech: Spontaneous with lower volume  Speech Volume: Decreased  Handedness: Right   Mood and  Affect  Mood: Anxious; Depressed  Affect: Restricted and tearful   Thought Process  Thought Processes: Coherent  Descriptions of Associations:Intact  Orientation:Full (Time, Place and Person)  Thought Content: Positive for suicidal thoughts  History of Schizophrenia/Schizoaffective disorder:No  Duration of Psychotic Symptoms:NA Hallucinations:Hallucinations: None  Ideas of Reference:None  Suicidal Thoughts:Suicidal Thoughts: Yes, Active SI Active Intent and/or Plan: Without Means to Carry Out SI Passive Intent and/or Plan: Without Means to Carry Out  Homicidal Thoughts:Homicidal Thoughts: No   Sensorium  Memory: Immediate Good  Judgment: Poor  Insight: Fair  Chartered certified accountant: Good  Attention Span: Fair  Recall: Fair  Fund of Knowledge: Good  Language: Good   Psychomotor Activity  Psychomotor Activity: Decreased   Assets  Assets: Desire for Improvement (" I want to get better")   Sleep  Sleep: Sleep: Fair Number of Hours of Sleep: 4    Physical Exam: Physical Exam Constitutional:      Appearance: Normal appearance.  HENT:     Head: Normocephalic and atraumatic.     Nose: Nose normal.  Eyes:     Pupils: Pupils are equal, round, and reactive to light.  Cardiovascular:     Rate and Rhythm: Normal rate.     Pulses: Normal pulses.  Pulmonary:     Effort: Pulmonary effort is normal.  Skin:    General: Skin is warm.  Neurological:     Mental Status: He is alert and oriented to person, place, and time.    Review of Systems  Constitutional:  Negative for chills and fever.  HENT:  Negative for congestion and hearing loss.   Eyes:  Negative for blurred vision and double vision.  Respiratory:  Negative for cough and shortness of breath.   Cardiovascular:  Negative for chest  pain and palpitations.  Gastrointestinal:  Negative for heartburn, nausea and vomiting.  Neurological:  Negative for dizziness and sensory change.  Psychiatric/Behavioral:  Positive for depression and suicidal ideas. The patient has insomnia.    Blood pressure 115/67, pulse 69, temperature 97.9 F (36.6 C), temperature source Oral, resp. rate 19, height 5\' 11"  (1.803 m), weight 92.1 kg, SpO2 96%. Body mass index is 28.31 kg/m.   Treatment Plan Summary: Daily contact with patient to assess and evaluate symptoms and progress in treatment and Medication management  Patient is admitted to locked unit under safety precautions We will increase the dose of Lexapro to 10 mg to target depressive symptoms Patient was encouraged to talk to staff if he is overwhelmed with depression and suicidal thoughts, he agrees to do so Patient was encouraged to attend groups and work on coping strategies and a safe discharge plan Social worker consulted to get collateral from family and help with a safe discharge plan Continue on trazodone to help with depression and insomnia Will prescribe Vistaril as needed for anxiety  Lewanda Rife, MD

## 2022-09-24 NOTE — Group Note (Signed)
Date:  09/24/2022 Time:  1:06 AM  Group Topic/Focus:  Rediscovering Joy:   The focus of this group is to explore various ways to relieve stress in a positive manner.    Participation Level:  Minimal  Participation Quality:  Appropriate and Attentive  Affect:  Appropriate  Cognitive:  Alert and Appropriate  Insight: Appropriate and Good  Engagement in Group:  Developing/Improving  Modes of Intervention:  Rapport Building and Socialization  Additional Comments:     Christian Borgerding 09/24/2022, 1:06 AM

## 2022-09-24 NOTE — Group Note (Signed)
Recreation Therapy Group Note   Group Topic:Coping Skills  Group Date: 09/24/2022 Start Time: 1000 End Time: 1050 Facilitators: Rosina Lowenstein, LRT, CTRS Location:  Craft Room  Group Description: Mind Map.  Patient was provided a blank template of a diagram with 32 blank boxes in a tiered system, branching from the center (similar to a bubble chart). LRT directed patients to label the middle of the diagram "Coping Skills". LRT and patients then came up with 8 different coping skills as examples. Pt were directed to record their coping skills in the 2nd tier boxes closest to the center.  Patients would then share their coping skills with the group as LRT wrote them out. LRT gave a handout of 99 different coping skills at the end of group.   Goal Area(s) Addressed: Patients will be able to define "coping skills". Patient will identify new coping skills.  Patient will increase communication.   Affect/Mood: N/A   Participation Level: Did not attend    Clinical Observations/Individualized Feedback: Jorge Thomas came to group, however was pulled by MD. Pt did not return.  Plan: Continue to engage patient in RT group sessions 2-3x/week.   Rosina Lowenstein, LRT, CTRS 09/24/2022 11:45 AM

## 2022-09-24 NOTE — Plan of Care (Signed)
Patient appears sad and depressed. Patient states " I messed up with my life. I need help." Patient states that he like to go for a long term rehab program. Patient denies SI,HI and AVH. Patient attended groups. No PRN medications needed this shift. Appetite and energy level good. Support and encouragement given.

## 2022-09-24 NOTE — BH IP Treatment Plan (Signed)
Interdisciplinary Treatment and Diagnostic Plan Update  09/24/2022 Time of Session: 9:25AM Jorge Thomas MRN: [id number]  Principal Diagnosis: <principal problem not specified>  Secondary Diagnoses: Active Problems:   * No active hospital problems. *   Current Medications:  Current Facility-Administered Medications  Medication Dose Route Frequency Provider Last Rate Last Admin   acetaminophen (TYLENOL) tablet 650 mg  650 mg Oral Q6H PRN Bing Neighbors, NP       albuterol (VENTOLIN HFA) 108 (90 Base) MCG/ACT inhaler 2 puff  2 puff Inhalation Q6H PRN Bing Neighbors, NP   2 puff at 09/24/22 0841   alum & mag hydroxide-simeth (MAALOX/MYLANTA) 200-200-20 MG/5ML suspension 30 mL  30 mL Oral Q4H PRN Bing Neighbors, NP       haloperidol (HALDOL) tablet 5 mg  5 mg Oral Q6H PRN Myriam Forehand, NP       And   benztropine (COGENTIN) tablet 1 mg  1 mg Oral Q6H PRN Myriam Forehand, NP       escitalopram (LEXAPRO) tablet 5 mg  5 mg Oral Daily Myriam Forehand, NP   5 mg at 09/24/22 8295   gabapentin (NEURONTIN) capsule 100 mg  100 mg Oral BID Myriam Forehand, NP   100 mg at 09/24/22 0839   OLANZapine zydis (ZYPREXA) disintegrating tablet 5 mg  5 mg Oral Q8H PRN Myriam Forehand, NP       And   LORazepam (ATIVAN) tablet 1 mg  1 mg Oral PRN Myriam Forehand, NP       And   ziprasidone (GEODON) injection 20 mg  20 mg Intramuscular PRN Myriam Forehand, NP       magnesium hydroxide (MILK OF MAGNESIA) suspension 30 mL  30 mL Oral Daily PRN Bing Neighbors, NP       traZODone (DESYREL) tablet 50 mg  50 mg Oral QHS Myriam Forehand, NP   50 mg at 09/23/22 2126   PTA Medications: Medications Prior to Admission  Medication Sig Dispense Refill Last Dose   albuterol (PROVENTIL HFA;VENTOLIN HFA) 108 (90 BASE) MCG/ACT inhaler Inhale 2 puffs into the lungs every 6 (six) hours as needed for wheezing.       Patient Stressors: Marital or family conflict   Substance abuse    Patient Strengths: Ability for  insight  Motivation for treatment/growth   Treatment Modalities: Medication Management, Group therapy, Case management,  1 to 1 session with clinician, Psychoeducation, Recreational therapy.   Physician Treatment Plan for Primary Diagnosis: <principal problem not specified> Long Term Goal(s): Improvement in symptoms so as ready for discharge   Short Term Goals: Ability to identify changes in lifestyle to reduce recurrence of condition will improve Ability to verbalize feelings will improve Ability to disclose and discuss suicidal ideas Ability to demonstrate self-control will improve Ability to identify and develop effective coping behaviors will improve Ability to maintain clinical measurements within normal limits will improve Compliance with prescribed medications will improve Ability to identify triggers associated with substance abuse/mental health issues will improve  Medication Management: Evaluate patient's response, side effects, and tolerance of medication regimen.  Therapeutic Interventions: 1 to 1 sessions, Unit Group sessions and Medication administration.  Evaluation of Outcomes: Progressing  Physician Treatment Plan for Secondary Diagnosis: Active Problems:   * No active hospital problems. *  Long Term Goal(s): Improvement in symptoms so as ready for discharge   Short Term Goals: Ability to identify changes in lifestyle to reduce recurrence of  condition will improve Ability to verbalize feelings will improve Ability to disclose and discuss suicidal ideas Ability to demonstrate self-control will improve Ability to identify and develop effective coping behaviors will improve Ability to maintain clinical measurements within normal limits will improve Compliance with prescribed medications will improve Ability to identify triggers associated with substance abuse/mental health issues will improve     Medication Management: Evaluate patient's response, side effects,  and tolerance of medication regimen.  Therapeutic Interventions: 1 to 1 sessions, Unit Group sessions and Medication administration.  Evaluation of Outcomes: Progressing   RN Treatment Plan for Primary Diagnosis: <principal problem not specified> Long Term Goal(s): Knowledge of disease and therapeutic regimen to maintain health will improve  Short Term Goals: Ability to demonstrate self-control, Ability to participate in decision making will improve, Ability to verbalize feelings will improve, Ability to disclose and discuss suicidal ideas, Ability to identify and develop effective coping behaviors will improve, and Compliance with prescribed medications will improve  Medication Management: RN will administer medications as ordered by provider, will assess and evaluate patient's response and provide education to patient for prescribed medication. RN will report any adverse and/or side effects to prescribing provider.  Therapeutic Interventions: 1 on 1 counseling sessions, Psychoeducation, Medication administration, Evaluate responses to treatment, Monitor vital signs and CBGs as ordered, Perform/monitor CIWA, COWS, AIMS and Fall Risk screenings as ordered, Perform wound care treatments as ordered.  Evaluation of Outcomes: Progressing   LCSW Treatment Plan for Primary Diagnosis: <principal problem not specified> Long Term Goal(s): Safe transition to appropriate next level of care at discharge, Engage patient in therapeutic group addressing interpersonal concerns.  Short Term Goals: Engage patient in aftercare planning with referrals and resources, Increase social support, Increase ability to appropriately verbalize feelings, Increase emotional regulation, Facilitate acceptance of mental health diagnosis and concerns, Facilitate patient progression through stages of change regarding substance use diagnoses and concerns, Identify triggers associated with mental health/substance abuse issues, and  Increase skills for wellness and recovery  Therapeutic Interventions: Assess for all discharge needs, 1 to 1 time with Social worker, Explore available resources and support systems, Assess for adequacy in community support network, Educate family and significant other(s) on suicide prevention, Complete Psychosocial Assessment, Interpersonal group therapy.  Evaluation of Outcomes: Progressing   Progress in Treatment: Attending groups: No. Participating in groups: No. Taking medication as prescribed: Yes. Toleration medication: Yes. Family/Significant other contact made: No, will contact:  once permission has been given. Patient understands diagnosis: Yes. Discussing patient identified problems/goals with staff: Yes. Medical problems stabilized or resolved: Yes. Denies suicidal/homicidal ideation: Yes. Issues/concerns per patient self-inventory: No. Other: none  New problem(s) identified: No, Describe:  none  New Short Term/Long Term Goal(s): detox, elimination of symptoms of psychosis, medication management for mood stabilization; elimination of SI thoughts; development of comprehensive mental wellness/sobriety plan.   Patient Goals:  "try to get ready for my depression and drug and alcohol treatment"  Discharge Plan or Barriers:  CSW to assist in the development of appropriate discharge plans.  Unclear at this time of the patient is wanting to pursue a residential facility.    Reason for Continuation of Hospitalization: Anxiety Depression Medication stabilization Suicidal ideation Withdrawal symptoms  Estimated Length of Stay:  1-7 days  Last 3 [place] Suicide Severity Risk Score: Flowsheet Row Admission (Current) from 09/22/2022 in [facility] Most recent reading at 09/23/2022  5:00 AM ED from 09/22/2022 in [facility] Most recent reading at 09/22/2022 12:52 PM  C-SSRS RISK CATEGORY No Risk Moderate Risk       Last PHQ  2/9 Scores:     No data to display          Scribe for Treatment Team: Harden Mo, Alexander Mt 09/24/2022 10:41 AM

## 2022-09-24 NOTE — Group Note (Signed)
Date:  09/24/2022 Time:  10:15 AM  Group Topic/Focus:  Outdoor Recreation/Building therapeutic rapport.    Participation Level:  Did Not Attend   [name] 09/24/2022, 10:15 AM

## 2022-09-24 NOTE — Group Note (Signed)
Date:  09/24/2022 Time:  9:49 PM  Group Topic/Focus:  Personal Choices and Values:   The focus of this group is to help patients assess and explore the importance of values in their lives, how their values affect their decisions, how they express their values and what opposes their expression.    Participation Level:  Active  Participation Quality:  Appropriate and Attentive  Affect:  Appropriate  Cognitive:  Alert and Appropriate  Insight: Appropriate and Good  Engagement in Group:  Developing/Improving  Modes of Intervention:  Clarification, Discussion, Education, and Support  Additional Comments:     Rhealynn Myhre 09/24/2022, 9:49 PM

## 2022-09-24 NOTE — Plan of Care (Signed)
  Problem: Education: Goal: Knowledge of General Education information will improve Description: Including pain rating scale, medication(s)/side effects and non-pharmacologic comfort measures Outcome: Progressing   Problem: Health Behavior/Discharge Planning: Goal: Ability to manage health-related needs will improve Outcome: Progressing   Problem: Clinical Measurements: Goal: Ability to maintain clinical measurements within normal limits will improve Outcome: Progressing Goal: Will remain free from infection Outcome: Progressing Goal: Diagnostic test results will improve Outcome: Progressing Goal: Respiratory complications will improve Outcome: Progressing Goal: Cardiovascular complication will be avoided Outcome: Progressing   Problem: Activity: Goal: Risk for activity intolerance will decrease Outcome: Progressing   Problem: Nutrition: Goal: Adequate nutrition will be maintained Outcome: Progressing   Problem: Coping: Goal: Level of anxiety will decrease Outcome: Progressing   Problem: Elimination: Goal: Will not experience complications related to bowel motility Outcome: Progressing Goal: Will not experience complications related to urinary retention Outcome: Progressing   Problem: Pain Managment: Goal: General experience of comfort will improve Outcome: Progressing   Problem: Safety: Goal: Ability to remain free from injury will improve Outcome: Progressing   Problem: Skin Integrity: Goal: Risk for impaired skin integrity will decrease Outcome: Progressing   Problem: Education: Goal: Knowledge of [facility] Education information/materials will improve Outcome: Progressing Goal: Emotional status will improve Outcome: Progressing Goal: Mental status will improve Outcome: Progressing Goal: Verbalization of understanding the information provided will improve Outcome: Progressing   Problem: Activity: Goal: Interest or engagement in activities will  improve Outcome: Progressing Goal: Sleeping patterns will improve Outcome: Progressing   Problem: Coping: Goal: Ability to verbalize frustrations and anger appropriately will improve Outcome: Progressing Goal: Ability to demonstrate self-control will improve Outcome: Progressing   Problem: Health Behavior/Discharge Planning: Goal: Identification of resources available to assist in meeting health care needs will improve Outcome: Progressing Goal: Compliance with treatment plan for underlying cause of condition will improve Outcome: Progressing   Problem: Physical Regulation: Goal: Ability to maintain clinical measurements within normal limits will improve Outcome: Progressing   Problem: Safety: Goal: Periods of time without injury will increase Outcome: Progressing   

## 2022-09-25 DIAGNOSIS — F322 Major depressive disorder, single episode, severe without psychotic features: Secondary | ICD-10-CM | POA: Diagnosis not present

## 2022-09-25 NOTE — Plan of Care (Signed)
Patient isolates to himself with a sad affect. Patient stated that he did not go to groups because his stomach upset.Patient stated that diarrhea subsided. Patient denies SI,HI and AVH. Appetite and energy level good. Support and encouragement given.

## 2022-09-25 NOTE — Progress Notes (Signed)
Patient presents slightly irritated. Denies SI, HI, AVH. Endorses anxiety and insomnia. Patient requesting sleep medication increased. Prn for sleep and anxiety given. Effective. Patient in dayroom watching tv. Minimal interaction with staff and peers.  Encouragement and support provided. Safety checks maintained. Meds given as prescribed. Pt receptive and remains safe on unit with q 15 min checks.

## 2022-09-25 NOTE — Group Note (Signed)
Date:  09/25/2022 Time:  4:30 PM  Group Topic/Focus:  Outdoor recreation structured therapy group.    Participation Level:  Did Not Attend   Rosaura Carpenter 09/25/2022, 4:30 PM
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
## 2022-09-25 NOTE — Progress Notes (Signed)
[facility] MD Progress Note  09/25/2022 10:53 AM Jorge Thomas  MRN:  [id number]  Subjective:  Pt chart reviewed, discussed with interdisciplinary team, and seen on rounds. Reports today mood is "a little depressed". Endorses passive suicidal ideations, no plan or intent. Denies homicidal ideations. Denies auditory visual hallucinations or paranoia. Reports sleep and appetite have been fair. Does endorse 2 episodes of diarrhea earlier this morning. Declined antidiarrheal. Reports goal is to go to substance use residential program after discharge.   Principal Problem: MDD (major depressive disorder), single episode, severe , no psychosis (HCC)  Diagnosis: Principal Problem:   MDD (major depressive disorder), single episode, severe , no psychosis (HCC)  Total Time spent with patient:  25 minutes  Past Psychiatric History: None reported by pt  Past Medical History:  Past Medical History:  Diagnosis Date   Asthma     Past Surgical History:  Procedure Laterality Date   left hand surgery     left knee surery     Family History: History reviewed. No pertinent family history. Family Psychiatric  History: None reported by pt Social History:  Social History   Substance and Sexual Activity  Alcohol Use Yes   Alcohol/week: 1.0 - 2.0 standard drink of alcohol   Types: 1 - 2 Cans of beer per week   Comment: daily     Social History   Substance and Sexual Activity  Drug Use No    Social History   Socioeconomic History   Marital status: Single    Spouse name: Not on file   Number of children: Not on file   Years of education: Not on file   Highest education level: Not on file  Occupational History   Not on file  Tobacco Use   Smoking status: Former    Current packs/day: 0.00    Types: Cigarettes    Quit date: 06/08/2010    Years since quitting: 12.3   Smokeless tobacco: Never  Substance and Sexual Activity   Alcohol use: Yes    Alcohol/week: 1.0 - 2.0 standard drink of alcohol     Types: 1 - 2 Cans of beer per week    Comment: daily   Drug use: No   Sexual activity: Not on file  Other Topics Concern   Not on file  Social History Narrative   Not on file   Social Determinants of Health   Financial Resource Strain: Not on file  Food Insecurity: No Food Insecurity (09/23/2022)   Hunger Vital Sign    Worried About Running Out of Food in the Last Year: Never true    Ran Out of Food in the Last Year: Never true  Transportation Needs: No Transportation Needs (09/23/2022)   PRAPARE - Administrator, Civil Service (Medical): No    Lack of Transportation (Non-Medical): No  Physical Activity: Not on file  Stress: Not on file  Social Connections: Not on file    Sleep: Fair  Appetite:  Fair  Current Medications: Current Facility-Administered Medications  Medication Dose Route Frequency Provider Last Rate Last Admin   acetaminophen (TYLENOL) tablet 650 mg  650 mg Oral Q6H PRN Bing Neighbors, NP       albuterol (VENTOLIN HFA) 108 (90 Base) MCG/ACT inhaler 2 puff  2 puff Inhalation Q6H PRN Bing Neighbors, NP   2 puff at 09/25/22 0813   alum & mag hydroxide-simeth (MAALOX/MYLANTA) 200-200-20 MG/5ML suspension 30 mL  30 mL Oral Q4H PRN Bing Neighbors,  NP       haloperidol (HALDOL) tablet 5 mg  5 mg Oral Q6H PRN Myriam Forehand, NP       And   benztropine (COGENTIN) tablet 1 mg  1 mg Oral Q6H PRN Myriam Forehand, NP       escitalopram (LEXAPRO) tablet 10 mg  10 mg Oral Daily Lewanda Rife, MD   10 mg at 09/25/22 1027   gabapentin (NEURONTIN) capsule 100 mg  100 mg Oral BID Myriam Forehand, NP   100 mg at 09/25/22 2536   hydrOXYzine (ATARAX) tablet 50 mg  50 mg Oral Q6H PRN Lewanda Rife, MD   50 mg at 09/25/22 0813   OLANZapine zydis (ZYPREXA) disintegrating tablet 5 mg  5 mg Oral Q8H PRN Myriam Forehand, NP       And   LORazepam (ATIVAN) tablet 1 mg  1 mg Oral PRN Myriam Forehand, NP       And   ziprasidone (GEODON) injection 20 mg  20 mg  Intramuscular PRN Myriam Forehand, NP       magnesium hydroxide (MILK OF MAGNESIA) suspension 30 mL  30 mL Oral Daily PRN Bing Neighbors, NP       traZODone (DESYREL) tablet 50 mg  50 mg Oral QHS Myriam Forehand, NP   50 mg at 09/24/22 2125    Lab Results:  Results for orders placed or performed during the hospital encounter of 09/22/22 (from the past 48 hour(s))  Ethanol     Status: None   Collection Time: 09/23/22  2:59 PM  Result Value Ref Range   Alcohol, Ethyl (B) <10 <10 mg/dL    Comment: (NOTE) Lowest detectable limit for serum alcohol is 10 mg/dL.  For medical purposes only. Performed at [facility], [address] [place], [place] [address]     Blood Alcohol level:  Lab Results  Component Value Date   [facility] <10 09/23/2022    Metabolic Disorder Labs: No results found for: "HGBA1C", "MPG" No results found for: "PROLACTIN" No results found for: "CHOL", "TRIG", "HDL", "CHOLHDL", "VLDL", "LDLCALC"  Physical Findings: AIMS:  , ,  ,  ,    CIWA:  CIWA-Ar Total: 1 COWS:     Musculoskeletal: Strength & Muscle Tone: within normal limits Gait & Station: normal Patient leans: N/A  Psychiatric Specialty Exam:  Presentation  General Appearance:  Appropriate for Environment  Eye Contact: Minimal  Speech: Clear and Coherent; Normal Rate  Speech Volume: Decreased  Handedness: Right   Mood and Affect  Mood: Depressed  Affect: Flat   Thought Process  Thought Processes: Coherent; Goal Directed; Linear  Descriptions of Associations:Intact  Orientation:Full (Time, Place and Person)  Thought Content:Logical  History of Schizophrenia/Schizoaffective disorder:No  Duration of Psychotic Symptoms:No data recorded Hallucinations:Hallucinations: None  Ideas of Reference:None  Suicidal Thoughts:Suicidal Thoughts: Yes, Passive  Homicidal Thoughts:Homicidal Thoughts: No   Sensorium  Memory: Immediate  Good  Judgment: Intact  Insight: Present   Executive Functions  Concentration: Fair  Attention Span: Fair  Recall: [employer] of Knowledge: Fair  Language: Fair   Psychomotor Activity  Psychomotor Activity: Psychomotor Activity: Normal   Assets  Assets: Communication Skills; Desire for Improvement; Financial Resources/Insurance; Resilience   Sleep  Sleep: Sleep: Fair    Physical Exam: Physical Exam Constitutional:      General: He is not in acute distress.    Appearance: He is not ill-appearing, toxic-appearing or diaphoretic.  Eyes:  General: No scleral icterus. Cardiovascular:     Rate and Rhythm: Normal rate.  Pulmonary:     Effort: Pulmonary effort is normal. No respiratory distress.  Neurological:     Mental Status: He is alert and oriented to person, place, and time.  Psychiatric:        Attention and Perception: Attention and perception normal.        Mood and Affect: Mood is depressed. Affect is flat.        Speech: Speech normal.        Behavior: Behavior normal. Behavior is cooperative.        Thought Content: Thought content is not paranoid or delusional. Thought content includes suicidal ideation. Thought content does not include homicidal ideation. Thought content does not include homicidal or suicidal plan.        Cognition and Memory: Cognition and memory normal.        Judgment: Judgment normal.    Review of Systems  Constitutional:  Negative for chills and fever.  Respiratory:  Negative for shortness of breath.   Cardiovascular:  Negative for chest pain and palpitations.  Gastrointestinal:  Positive for diarrhea. Negative for abdominal pain.  Neurological:  Negative for headaches.  Psychiatric/Behavioral:  Positive for depression and suicidal ideas.    Blood pressure 104/63, pulse 60, temperature (!) 97.3 F (36.3 C), resp. rate 18, height 5\' 11"  (1.803 m), weight 92.1 kg, SpO2 99%. Body mass index is 28.31  kg/m.   Treatment Plan Summary:  MDD -Lexapro 10mg  oral daily  Agitation PRNs -haldol 5mg  oral every 6 hours PRN agitation  -cogentin 1mg  oral every 6 hours PRN tremors, agitation -zyprexa 5mg  oral every 8 hours prn anxiety -ativan 1mg  oral as needed anxiety, severe agitation -geodon 20mg  IM as needed agitation  Insomnia -trazodone 50mg  oral daily at bedtime  Anxiety -Hydroxyzine 50mg  oral every 6 hours PRN anxiety  Lauree Chandler, NP 09/25/2022, 10:53 AM

## 2022-09-25 NOTE — B[facility] Suicide Risk Assessment (Signed)
[facility] INPATIENT:  Family/Significant Other Suicide Prevention Education  Suicide Prevention Education:  Patient Refusal for Family/Significant Other Suicide Prevention Education: The patient Jorge Thomas has refused to provide written consent for family/significant other to be provided Family/Significant Other Suicide Prevention Education during admission and/or prior to discharge.  Physician notified. SPE completed with pt, as pt refused to consent to family contact. SPI pamphlet provided to pt and pt was encouraged to share information with support network, ask questions, and talk about any concerns relating to SPE. Pt denies access to guns/firearms and verbalized understanding of information provided. Mobile Crisis information also provided to pt.     Harden Mo 09/25/2022, 4:17 PM

## 2022-09-25 NOTE — Group Note (Signed)
Recreation Therapy Group Note   Group Topic:Goal Setting  Group Date: 09/25/2022 Start Time: 1000 End Time: 1100 Facilitators: Rosina Lowenstein, LRT, CTRS Location:  Craft Room  Group Description: Vision Board. Patients were given many different magazines, a glue stick, markers, and a piece of cardstock paper. LRT and pts discussed the importance of having goals in life. LRT and pts discussed the difference between short-term and long-term goals, as well as what a SMART goal is. LRT encouraged pts to create a vision board, with images they picked and then cut out with safety scissors from the magazine, for themselves, that capture their short and long-term goals. LRT encouraged pts to show and explain their vision board to the group.   Goal Area(s) Addressed:  Patient will gain knowledge of short vs. long term goals.  Patient will identify goals for themselves. Patient will practice setting SMART goals. Patient will verbalize their goals to LRT and peers.   Affect/Mood: N/A   Participation Level: Did not attend    Clinical Observations/Individualized Feedback: Joeseph did not attend group.  Plan: Continue to engage patient in RT group sessions 2-3x/week.   Rosina Lowenstein, LRT, CTRS 09/25/2022 11:38 AM

## 2022-09-25 NOTE — Group Note (Signed)
Date:  09/25/2022 Time:  8:59 PM  Group Topic/Focus:  Goals Group:   The focus of this group is to help patients establish daily goals to achieve during treatment and discuss how the patient can incorporate goal setting into their daily lives to aide in recovery.    Participation Level:  Active  Participation Quality:  Appropriate, Attentive, Sharing, and Supportive  Affect:  Appropriate  Cognitive:  Appropriate  Insight: Appropriate and Good  Engagement in Group:  Supportive  Modes of Intervention:  Discussion and Support  Additional Comments:     [name] 09/25/2022, 8:59 PM

## 2022-09-25 NOTE — Group Note (Signed)
Date:  09/25/2022 Time:  10:06 AM  Group Topic/Focus:  Developing a Wellness Toolbox:   The focus of this group is to help patients develop a "wellness toolbox" with skills and strategies to promote recovery upon discharge.    Participation Level:  Did Not Attend   Neymar Dowe 09/25/2022, 10:06 AM

## 2022-09-26 DIAGNOSIS — F1010 Alcohol abuse, uncomplicated: Secondary | ICD-10-CM | POA: Diagnosis present

## 2022-09-26 DIAGNOSIS — F1410 Cocaine abuse, uncomplicated: Secondary | ICD-10-CM | POA: Diagnosis present

## 2022-09-26 DIAGNOSIS — F322 Major depressive disorder, single episode, severe without psychotic features: Secondary | ICD-10-CM | POA: Diagnosis not present

## 2022-09-26 MED ORDER — BISMUTH SUBSALICYLATE 262 MG PO CHEW: 524.0000 mg | CHEWABLE_TABLET | Freq: Once | ORAL | Status: DC | PRN

## 2022-09-26 NOTE — Plan of Care (Signed)
  Problem: Education: Goal: Knowledge of General Education information will improve Description: Including pain rating scale, medication(s)/side effects and non-pharmacologic comfort measures Outcome: Progressing   Problem: Health Behavior/Discharge Planning: Goal: Ability to manage health-related needs will improve Outcome: Progressing   Problem: Clinical Measurements: Goal: Ability to maintain clinical measurements within normal limits will improve Outcome: Progressing   Problem: Nutrition: Goal: Adequate nutrition will be maintained Outcome: Progressing   Problem: Elimination: Goal: Will not experience complications related to bowel motility Outcome: Progressing   Problem: Pain Managment: Goal: General experience of comfort will improve Outcome: Progressing   Problem: Pain Managment: Goal: General experience of comfort will improve Outcome: Progressing   Problem: Safety: Goal: Ability to remain free from injury will improve Outcome: Progressing
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
## 2022-09-26 NOTE — B[facility] Counselor (Signed)
CSW has sent referrals to [facility], [facility] and [facility].  CSW to send referrals to [facility] with the physician statement once received.   [facility] interview is scheduled tomorrow, 09/27/2022 at 9AM, pt to let them know that he needs to do an assessment and needs someone in intake.  Penni Homans, MSW, LCSW 09/26/2022 1:52 PM

## 2022-09-26 NOTE — Group Note (Signed)
Recreation Therapy Group Note   Group Topic:Relaxation  Group Date: 09/26/2022 Start Time: 1000 End Time: 1050 Facilitators: Rosina Lowenstein, LRT, CTRS Location:  Craft Room  Group Description: PMR (Progressive Muscle Relaxation). LRT asks patients their current level of stress/anxiety from 1-10, with 10 being the highest. LRT educates patients on what PMR is and the benefits that come from it. Patients are asked to sit with their feet flat on the floor while sitting up and all the way back in their chair, if possible. LRT and pts follow a prompt through a speaker that requires you to tense and release different muscles in their body and focus on their breathing. During session, lights are off and soft music is being played. Pts are given a stress ball to use if needed. At the end of the prompt, LRT asks patients to rank their current levels of stress/anxiety from 1-10, 10 being the highest. LRT provided pts with an information handout on PMR.   Goal Area(s) Addressed:  Patients will be able to describe progressive muscle relaxation.  Patient will practice using relaxation technique. Patient will identify a new coping skill.  Patient will follow multistep directions to reduce anxiety and stress.   Affect/Mood: N/A   Participation Level: Did not attend    Clinical Observations/Individualized Feedback: Algene did not attend group.   Plan: Continue to engage patient in RT group sessions 2-3x/week.   Rosina Lowenstein, LRT, CTRS 09/26/2022 11:31 AM

## 2022-09-26 NOTE — Plan of Care (Signed)
  Problem: Education: Goal: Knowledge of General Education information will improve Description: Including pain rating scale, medication(s)/side effects and non-pharmacologic comfort measures Outcome: Progressing   Problem: Health Behavior/Discharge Planning: Goal: Ability to manage health-related needs will improve Outcome: Progressing   Problem: Clinical Measurements: Goal: Ability to maintain clinical measurements within normal limits will improve Outcome: Progressing Goal: Will remain free from infection Outcome: Progressing Goal: Diagnostic test results will improve Outcome: Progressing Goal: Respiratory complications will improve Outcome: Progressing Goal: Cardiovascular complication will be avoided Outcome: Progressing   Problem: Activity: Goal: Risk for activity intolerance will decrease Outcome: Progressing   Problem: Nutrition: Goal: Adequate nutrition will be maintained Outcome: Progressing   Problem: Coping: Goal: Level of anxiety will decrease Outcome: Progressing   Problem: Elimination: Goal: Will not experience complications related to bowel motility Outcome: Progressing Goal: Will not experience complications related to urinary retention Outcome: Progressing   Problem: Pain Managment: Goal: General experience of comfort will improve Outcome: Progressing   Problem: Safety: Goal: Ability to remain free from injury will improve Outcome: Progressing   Problem: Skin Integrity: Goal: Risk for impaired skin integrity will decrease Outcome: Progressing   Problem: Education: Goal: Knowledge of [facility] Education information/materials will improve Outcome: Progressing Goal: Emotional status will improve Outcome: Progressing Goal: Mental status will improve Outcome: Progressing Goal: Verbalization of understanding the information provided will improve Outcome: Progressing   Problem: Activity: Goal: Interest or engagement in activities will  improve Outcome: Progressing Goal: Sleeping patterns will improve Outcome: Progressing   Problem: Coping: Goal: Ability to verbalize frustrations and anger appropriately will improve Outcome: Progressing Goal: Ability to demonstrate self-control will improve Outcome: Progressing   Problem: Health Behavior/Discharge Planning: Goal: Identification of resources available to assist in meeting health care needs will improve Outcome: Progressing Goal: Compliance with treatment plan for underlying cause of condition will improve Outcome: Progressing   Problem: Physical Regulation: Goal: Ability to maintain clinical measurements within normal limits will improve Outcome: Progressing   Problem: Safety: Goal: Periods of time without injury will increase Outcome: Progressing   

## 2022-09-26 NOTE — Progress Notes (Signed)
Patient presents with flat affect but brightens on approach. Appears much brighter than previous days. Noted in dayroom watching television with peers. Minimal interaction with peers. Isolative to self. Reports having some improvement in mood. Plan to d/c to long term rehab. Complaints of anxiety and insomnia prns given, waiting on effectiveness.  Encouragement and support provided. Safety checks maintained, medications given as prescribed. Pt receptive and remains safe on unit with q 15 min checks.

## 2022-09-26 NOTE — Progress Notes (Signed)
[facility] MD Progress Note  09/26/2022 1:19 PM Jorge Thomas  MRN:  [id number]  Subjective:  Pt chart reviewed, discussed with interdisciplinary team, and seen on rounds. Endorses mood today is "ok". States he experienced 1 episode of diarrhea earlier this morning. Denies blood in diarrhea. Denies fever, chills, abdominal pain, other symptoms. Discussed ordering pepto bismol and he was in agreement. He denies suicidal, homicidal ideations. He denies auditory visual hallucinations or paranoia. States appetite and sleep are fair. He continues to be interested in residential substance use treatment programs. Appreciate social work in working with pt. Pt endorses using alcohol once/week 12 pack/occasion. Pt endorses use of cocaine once every 2 weeks, 1 gram/occasion. Alcohol on 09/23/22 was <10. UDS on 09/22/22 +cocaine.  Principal Problem: MDD (major depressive disorder), single episode, severe , no psychosis (HCC)  Diagnosis: Principal Problem:   MDD (major depressive disorder), single episode, severe , no psychosis (HCC) Active Problems:   Cocaine abuse (HCC)   Alcohol abuse  Total Time spent with patient: 25 minutes  Past Psychiatric History: None reported by pt  Past Medical History:  Past Medical History:  Diagnosis Date   Asthma     Past Surgical History:  Procedure Laterality Date   left hand surgery     left knee surery     Family History: History reviewed. No pertinent family history. Family Psychiatric  History: None reported by pt Social History:  Social History   Substance and Sexual Activity  Alcohol Use Yes   Alcohol/week: 1.0 - 2.0 standard drink of alcohol   Types: 1 - 2 Cans of beer per week   Comment: daily     Social History   Substance and Sexual Activity  Drug Use No    Social History   Socioeconomic History   Marital status: Single    Spouse name: Not on file   Number of children: Not on file   Years of education: Not on file   Highest education  level: Not on file  Occupational History   Not on file  Tobacco Use   Smoking status: Former    Current packs/day: 0.00    Types: Cigarettes    Quit date: 06/08/2010    Years since quitting: 12.3   Smokeless tobacco: Never  Substance and Sexual Activity   Alcohol use: Yes    Alcohol/week: 1.0 - 2.0 standard drink of alcohol    Types: 1 - 2 Cans of beer per week    Comment: daily   Drug use: No   Sexual activity: Not on file  Other Topics Concern   Not on file  Social History Narrative   Not on file   Social Determinants of Health   Financial Resource Strain: Not on file  Food Insecurity: No Food Insecurity (09/23/2022)   Hunger Vital Sign    Worried About Running Out of Food in the Last Year: Never true    Ran Out of Food in the Last Year: Never true  Transportation Needs: No Transportation Needs (09/23/2022)   PRAPARE - Administrator, Civil Service (Medical): No    Lack of Transportation (Non-Medical): No  Physical Activity: Not on file  Stress: Not on file  Social Connections: Not on file   Sleep: Fair  Appetite:  Fair  Current Medications: Current Facility-Administered Medications  Medication Dose Route Frequency Provider Last Rate Last Admin   acetaminophen (TYLENOL) tablet 650 mg  650 mg Oral Q6H PRN Bing Neighbors, NP  albuterol (VENTOLIN HFA) 108 (90 Base) MCG/ACT inhaler 2 puff  2 puff Inhalation Q6H PRN Bing Neighbors, NP   2 puff at 09/26/22 0820   alum & mag hydroxide-simeth (MAALOX/MYLANTA) 200-200-20 MG/5ML suspension 30 mL  30 mL Oral Q4H PRN Bing Neighbors, NP       haloperidol (HALDOL) tablet 5 mg  5 mg Oral Q6H PRN Myriam Forehand, NP       And   benztropine (COGENTIN) tablet 1 mg  1 mg Oral Q6H PRN Myriam Forehand, NP       bismuth subsalicylate (PEPTO BISMOL) chewable tablet 524 mg  524 mg Oral Once PRN Lauree Chandler, NP       escitalopram (LEXAPRO) tablet 10 mg  10 mg Oral Daily Lewanda Rife, MD   10 mg at 09/26/22  0820   hydrOXYzine (ATARAX) tablet 50 mg  50 mg Oral Q6H PRN Lewanda Rife, MD   50 mg at 09/25/22 2108   OLANZapine zydis (ZYPREXA) disintegrating tablet 5 mg  5 mg Oral Q8H PRN Myriam Forehand, NP       And   LORazepam (ATIVAN) tablet 1 mg  1 mg Oral PRN Myriam Forehand, NP       And   ziprasidone (GEODON) injection 20 mg  20 mg Intramuscular PRN Myriam Forehand, NP       magnesium hydroxide (MILK OF MAGNESIA) suspension 30 mL  30 mL Oral Daily PRN Bing Neighbors, NP       traZODone (DESYREL) tablet 50 mg  50 mg Oral QHS Myriam Forehand, NP   50 mg at 09/25/22 2108    Lab Results: No results found for this or any previous visit (from the past 48 hour(s)).  Blood Alcohol level:  Lab Results  Component Value Date   ETH <10 09/23/2022    Metabolic Disorder Labs: No results found for: "HGBA1C", "MPG" No results found for: "PROLACTIN" No results found for: "CHOL", "TRIG", "HDL", "CHOLHDL", "VLDL", "LDLCALC"  Physical Findings: AIMS:  , ,  ,  ,    CIWA:  CIWA-Ar Total: 1 COWS:     Musculoskeletal: Strength & Muscle Tone: within normal limits Gait & Station: normal Patient leans: N/A  Psychiatric Specialty Exam:  Presentation  General Appearance:  Appropriate for Environment  Eye Contact: Minimal  Speech: Clear and Coherent; Normal Rate  Speech Volume: Decreased  Handedness: Right   Mood and Affect  Mood: -- ("ok")  Affect: Flat   Thought Process  Thought Processes: Coherent; Goal Directed; Linear  Descriptions of Associations:Intact  Orientation:Full (Time, Place and Person)  Thought Content:Logical  History of Schizophrenia/Schizoaffective disorder:No  Duration of Psychotic Symptoms:No data recorded Hallucinations:Hallucinations: None  Ideas of Reference:None  Suicidal Thoughts:Suicidal Thoughts: No  Homicidal Thoughts:Homicidal Thoughts: No   Sensorium  Memory: Immediate Good  Judgment: Intact  Insight: Present   Executive  Functions  Concentration: Fair  Attention Span: Fair  Recall: [employer] of Knowledge: Fair  Language: Fair   Psychomotor Activity  Psychomotor Activity: Psychomotor Activity: Normal   Assets  Assets: Communication Skills; Desire for Improvement; Financial Resources/Insurance; Resilience   Sleep  Sleep: Sleep: Fair    Physical Exam: Physical Exam Constitutional:      General: He is not in acute distress.    Appearance: He is not ill-appearing, toxic-appearing or diaphoretic.  Eyes:     General: No scleral icterus. Cardiovascular:     Rate and Rhythm: Normal rate.  Pulmonary:  Effort: Pulmonary effort is normal. No respiratory distress.  Neurological:     Mental Status: He is alert and oriented to person, place, and time.  Psychiatric:        Attention and Perception: Attention and perception normal.        Mood and Affect: Mood normal. Affect is flat.        Speech: Speech normal.        Behavior: Behavior normal. Behavior is cooperative.        Thought Content: Thought content normal.        Cognition and Memory: Cognition and memory normal.        Judgment: Judgment normal.    Review of Systems  Constitutional:  Negative for chills and fever.  Respiratory:  Negative for shortness of breath.   Cardiovascular:  Negative for chest pain and palpitations.  Gastrointestinal:  Positive for diarrhea. Negative for abdominal pain.  Neurological:  Negative for headaches.   Blood pressure 122/73, pulse 81, temperature (!) 97.4 F (36.3 C), resp. rate 18, height 5\' 11"  (1.803 m), weight 92.1 kg, SpO2 96%. Body mass index is 28.31 kg/m.   Treatment Plan Summary: Daily contact with patient to assess and evaluate symptoms and progress in treatment, Medication management, and Plan    MDD -Lexapro 10mg  oral daily   Agitation PRNs -haldol 5mg  oral every 6 hours PRN agitation  -cogentin 1mg  oral every 6 hours PRN tremors, agitation -zyprexa 5mg  oral every  8 hours prn anxiety -ativan 1mg  oral as needed anxiety, severe agitation -geodon 20mg  IM as needed agitation   Insomnia -trazodone 50mg  oral daily at bedtime   Anxiety -Hydroxyzine 50mg  oral every 6 hours PRN anxiety  PRNs -Tylenol 650mg  oral every 6 hours PRN mild pain -Maalox/Mylanta 30mL oral every 4 hours PRN indigestion -Pepto Bismol 524mg  oral once PRN diarrhea or loose stools, indigestion -Milk of magnesia 30mL oral daily PRN mild constipation  Asthma -Ventolin HFA 2 puff inhalation every 6 hours PRN wheezing  Lauree Chandler, NP 09/26/2022, 1:19 PM

## 2022-09-26 NOTE — Group Note (Signed)
Date:  09/26/2022 Time:  4:16 PM  Group Topic/Focus:  Outdoor Recreation/Activity    Participation Level:  Did Not Attend   Lynelle Smoke [facility] 09/26/2022, 4:16 PM

## 2022-09-26 NOTE — Plan of Care (Signed)
Problem: Coping: Goal: Level of anxiety will decrease Outcome: Progressing   Problem: Safety: Goal: Ability to remain free from injury will improve Outcome: Progressing   Problem: Education: Goal: Emotional status will improve Outcome: Progressing Goal: Verbalization of understanding the information provided will improve Outcome: Progressing

## 2022-09-26 NOTE — Progress Notes (Signed)
D- Patient alert and oriented x 4. Affect flat/mood. Denies SI/ HI/ AVH. Patient denies pain. Patient endorses depression and anxiety. His goal of the day is to "try and get help; I want to go to long term rehab". A- Scheduled medications administered to patient, per MD orders. Support and encouragement provided.  Routine safety checks conducted every 15 minutes without incident.  Patient informed to notify staff with problems or concerns and verbalizes understanding. R- No adverse drug reactions noted.  Patient compliant with medications and treatment plan. Patient receptive, calm and cooperative. He is isolative to his room except for meals   Patient contracts for safety and  remains safe on the unit at this time.

## 2022-09-26 NOTE — Group Note (Signed)
Date:  09/26/2022 Time:  9:55 AM  Group Topic/Focus:  Goals Group:   The focus of this group is to help patients establish daily goals to achieve during treatment and discuss how the patient can incorporate goal setting into their daily lives to aide in recovery.    Participation Level:  Did Not Attend   Lynelle Smoke [facility] 09/26/2022, 9:55 AM

## 2022-09-27 ENCOUNTER — Other Ambulatory Visit: Payer: Self-pay

## 2022-09-27 DIAGNOSIS — F322 Major depressive disorder, single episode, severe without psychotic features: Secondary | ICD-10-CM | POA: Diagnosis not present

## 2022-09-27 MED ORDER — ESCITALOPRAM OXALATE 10 MG PO TABS
10.0000 mg | ORAL_TABLET | Freq: Every day | ORAL | 0 refills | Status: DC
  Filled 2022-09-27: qty 30, 30d supply, fill #0

## 2022-09-27 MED ORDER — HYDROXYZINE HCL 50 MG PO TABS
50.0000 mg | ORAL_TABLET | Freq: Four times a day (QID) | ORAL | 0 refills | Status: DC | PRN
  Filled 2022-09-27: qty 30, 8d supply, fill #0

## 2022-09-27 MED ORDER — TRAZODONE HCL 50 MG PO TABS
50.0000 mg | ORAL_TABLET | Freq: Every day | ORAL | 0 refills | Status: DC
  Filled 2022-09-27: qty 30, 30d supply, fill #0

## 2022-09-27 NOTE — Group Note (Signed)
Recreation Therapy Group Note   Group Topic:Healthy Support Systems  Group Date: 09/27/2022 Start Time: 1400 End Time: 1455 Facilitators: Rosina Lowenstein, LRT, CTRS Location:  Craft Room  Group Description: Straw Bridge. Individually, patients were given 10 plastic drinking straws and an equal length of masking tape. Using the materials provided, patients were instructed to build a free-standing bridge-like structure to suspend an everyday item (ex: puzzle box) off the floor or table surface. All materials were required to be used in Secondary school teacher. LRT facilitated post-activity discussion reviewing how we, humans, are like the structure we built; when things get too heavy in our life and we do not have adequate supports/coping skills, then we will fall just like the straw-built structure will. LRT focused on how having a "base" or structure on the bottom was necessary for the object to stand, meaning we must be secure and stable first before building on ourselves or others. Patients were encouraged to name 2 healthy supports in their life and reflect on how the skills used in this activity can be generalized to daily life post discharge.   Goal Area(s) Addressed:  Patient will identify two healthy support systems in their life. Patient will work on Product manager. Patient will verbalize the importance of having a strong and steady "base".  Patient will follow multi-step directions. Patients will engage in creativity and use all provided materials.   Affect/Mood: N/A   Participation Level: Did not attend    Clinical Observations/Individualized Feedback: Shelley fif not attend group.   Plan: Continue to engage patient in RT group sessions 2-3x/week.   Rosina Lowenstein, LRT, CTRS 09/27/2022 2:14 PM

## 2022-09-27 NOTE — Group Note (Signed)
Date:  09/27/2022 Time:  5:19 PM  Group Topic/Focus:  Self Care:   The focus of this group is to help patients understand the importance of self-care in order to improve or restore emotional, physical, spiritual, interpersonal, and financial health.    Participation Level:  Did Not Attend  Participation Quality:    Affect:    Cognitive:    Insight:   Engagement in Group:    Modes of Intervention:    Additional Comments:    Melisssa Donner 09/27/2022, 5:19 PM

## 2022-09-27 NOTE — Progress Notes (Signed)
  [facility] Adult Case Management Discharge Plan :  Will you be returning to the same living situation after discharge:  No. At discharge, do you have transportation home?: Yes,  CSW to assist with transportation needs. Do you have the ability to pay for your medications: No.  Release of information consent forms completed and in the chart;  Patient's signature needed at discharge.  Patient to Follow up at:  Follow-up Information     Services, [facility] .   Why: Elvera Bicker been accepted to the program 09/28/2022 at [facility]. Contact information: [address] [place] [address] [phone]                 Next level of care provider has access to [facility] Link:no  Safety Planning and Suicide Prevention discussed: Yes,  Spe completed with the patient.     Has patient been referred to the Quitline?: Patient refused referral for treatment  Patient has been referred for addiction treatment: Yes, the patient will follow up with an inpatient provider for substance use disorder.  appointment made 09/28/2022 at [employer], LCSW 09/27/2022, 1:45 PM

## 2022-09-27 NOTE — Progress Notes (Signed)
   09/27/22 1000  Psych Admission Type (Psych Patients Only)  Admission Status Voluntary  Psychosocial Assessment  Patient Complaints Worrying  Eye Contact Brief  Facial Expression Flat  Affect Flat  Speech Soft  Interaction Assertive  Motor Activity Slow  Appearance/Hygiene In scrubs  Behavior Characteristics Cooperative  Mood Sad  Thought Process  Coherency WDL  Content WDL  Delusions None reported or observed  Perception WDL  Hallucination None reported or observed  Judgment Impaired  Confusion WDL  Danger to Self  Current suicidal ideation? Denies  Self-Injurious Behavior No self-injurious ideation or behavior indicators observed or expressed   Agreement Not to Harm Self Yes  Danger to Others  Danger to Others None reported or observed

## 2022-09-27 NOTE — Plan of Care (Addendum)
Problem: Education: Goal: Knowledge of General Education information will improve Description: Including pain rating scale, medication(s)/side effects and non-pharmacologic comfort measures Outcome: Progressing   Problem: Health Behavior/Discharge Planning: Goal: Ability to manage health-related needs will improve Outcome: Progressing   Problem: Clinical Measurements: Goal: Ability to maintain clinical measurements within normal limits will improve Outcome: Progressing Goal: Will remain free from infection Outcome: Progressing Goal: Diagnostic test results will improve Outcome: Progressing Goal: Respiratory complications will improve Outcome: Progressing Goal: Cardiovascular complication will be avoided Outcome: Progressing   Problem: Activity: Goal: Risk for activity intolerance will decrease Outcome: Progressing   Problem: Nutrition: Goal: Adequate nutrition will be maintained Outcome: Progressing   Problem: Coping: Goal: Level of anxiety will decrease Outcome: Progressing   Problem: Elimination: Goal: Will not experience complications related to bowel motility Outcome: Progressing Goal: Will not experience complications related to urinary retention Outcome: Progressing   Problem: Pain Managment: Goal: General experience of comfort will improve Outcome: Progressing   Problem: Safety: Goal: Ability to remain free from injury will improve Outcome: Progressing   Problem: Skin Integrity: Goal: Risk for impaired skin integrity will decrease Outcome: Progressing   Problem: Education: Goal: Knowledge of [place] [facility] Education information/materials will improve Outcome: Progressing Goal: Emotional status will improve Outcome: Progressing Goal: Mental status will improve Outcome: Progressing Goal: Verbalization of understanding the information provided will improve Outcome: Progressing   Problem: Activity: Goal: Interest or engagement in activities will  improve Outcome: Progressing Goal: Sleeping patterns will improve Outcome: Progressing   Problem: Coping: Goal: Ability to verbalize frustrations and anger appropriately will improve Outcome: Progressing Goal: Ability to demonstrate self-control will improve Outcome: Progressing   Problem: Health Behavior/Discharge Planning: Goal: Identification of resources available to assist in meeting health care needs will improve Outcome: Progressing Goal: Compliance with treatment plan for underlying cause of condition will improve Outcome: Progressing   Problem: Physical Regulation: Goal: Ability to maintain clinical measurements within normal limits will improve Outcome: Progressing   Problem: Safety: Goal: Periods of time without injury will increase Outcome: Progressing

## 2022-09-27 NOTE — Progress Notes (Signed)
[facility] MD Progress Note  09/27/2022 3:10 PM Jorge Thomas  MRN:  [id number]  Subjective:  Pt chart reviewed, discussed with interdisciplinary team, and seen on rounds. Today, he reported a "little bit" of depression and anxiety, denies suicidal ideations.  Sleep was fair, appetite if good.  Denies any withdrawal symptoms.  He was accepted into [facility] tomorrow, medication supply ordered.  Jorge Thomas feels ready for his next step.  Denies side effects from his medications.  Principal Problem: MDD (major depressive disorder), single episode, severe , no psychosis (HCC)  Diagnosis: Principal Problem:   MDD (major depressive disorder), single episode, severe , no psychosis (HCC) Active Problems:   Cocaine abuse (HCC)   Alcohol abuse  Total Time spent with patient: 25 minutes  Past Psychiatric History: None reported by pt  Past Medical History:  Past Medical History:  Diagnosis Date   Asthma     Past Surgical History:  Procedure Laterality Date   left hand surgery     left knee surery     Family History: History reviewed. No pertinent family history. Family Psychiatric  History: None reported by pt Social History:  Social History   Substance and Sexual Activity  Alcohol Use Yes   Alcohol/week: 1.0 - 2.0 standard drink of alcohol   Types: 1 - 2 Cans of beer per week   Comment: daily     Social History   Substance and Sexual Activity  Drug Use No    Social History   Socioeconomic History   Marital status: Single    Spouse name: Not on file   Number of children: Not on file   Years of education: Not on file   Highest education level: Not on file  Occupational History   Not on file  Tobacco Use   Smoking status: Former    Current packs/day: 0.00    Types: Cigarettes    Quit date: 06/08/2010    Years since quitting: 12.3   Smokeless tobacco: Never  Substance and Sexual Activity   Alcohol use: Yes    Alcohol/week: 1.0 - 2.0 standard drink of alcohol    Types: 1 - 2  Cans of beer per week    Comment: daily   Drug use: No   Sexual activity: Not on file  Other Topics Concern   Not on file  Social History Narrative   Not on file   Social Determinants of Health   Financial Resource Strain: Not on file  Food Insecurity: No Food Insecurity (09/23/2022)   Hunger Vital Sign    Worried About Running Out of Food in the Last Year: Never true    Ran Out of Food in the Last Year: Never true  Transportation Needs: No Transportation Needs (09/23/2022)   PRAPARE - Administrator, Civil Service (Medical): No    Lack of Transportation (Non-Medical): No  Physical Activity: Not on file  Stress: Not on file  Social Connections: Not on file   Sleep: Fair  Appetite:  Fair  Current Medications: Current Facility-Administered Medications  Medication Dose Route Frequency Provider Last Rate Last Admin   acetaminophen (TYLENOL) tablet 650 mg  650 mg Oral Q6H PRN Bing Neighbors, NP       albuterol (VENTOLIN HFA) 108 (90 Base) MCG/ACT inhaler 2 puff  2 puff Inhalation Q6H PRN Bing Neighbors, NP   2 puff at 09/26/22 0820   alum & mag hydroxide-simeth (MAALOX/MYLANTA) 200-200-20 MG/5ML suspension 30 mL  30 mL Oral Q4H  PRN Bing Neighbors, NP       haloperidol (HALDOL) tablet 5 mg  5 mg Oral Q6H PRN Myriam Forehand, NP       And   benztropine (COGENTIN) tablet 1 mg  1 mg Oral Q6H PRN Myriam Forehand, NP       bismuth subsalicylate (PEPTO BISMOL) chewable tablet 524 mg  524 mg Oral Once PRN Lauree Chandler, NP       escitalopram (LEXAPRO) tablet 10 mg  10 mg Oral Daily Lewanda Rife, MD   10 mg at 09/27/22 0824   hydrOXYzine (ATARAX) tablet 50 mg  50 mg Oral Q6H PRN Lewanda Rife, MD   50 mg at 09/27/22 0824   OLANZapine zydis (ZYPREXA) disintegrating tablet 5 mg  5 mg Oral Q8H PRN Myriam Forehand, NP       And   LORazepam (ATIVAN) tablet 1 mg  1 mg Oral PRN Myriam Forehand, NP       And   ziprasidone (GEODON) injection 20 mg  20 mg Intramuscular  PRN Myriam Forehand, NP       magnesium hydroxide (MILK OF MAGNESIA) suspension 30 mL  30 mL Oral Daily PRN Bing Neighbors, NP       traZODone (DESYREL) tablet 50 mg  50 mg Oral QHS Myriam Forehand, NP   50 mg at 09/26/22 2112    Lab Results: No results found for this or any previous visit (from the past 48 hour(s)).  Blood Alcohol level:  Lab Results  Component Value Date   ETH <10 09/23/2022    Metabolic Disorder Labs: No results found for: "HGBA1C", "MPG" No results found for: "PROLACTIN" No results found for: "CHOL", "TRIG", "HDL", "CHOLHDL", "VLDL", "LDLCALC"  Physical Findings: AIMS:  , ,  ,  ,    CIWA:  CIWA-Ar Total: 1 COWS:     Musculoskeletal: Strength & Muscle Tone: within normal limits Gait & Station: normal Patient leans: N/A  Psychiatric Specialty Exam: Physical Exam Constitutional:      General: He is not in acute distress.    Appearance: Normal appearance. He is not ill-appearing, toxic-appearing or diaphoretic.  HENT:     Nose: Nose normal.  Eyes:     General: No scleral icterus. Pulmonary:     Effort: Pulmonary effort is normal. No respiratory distress.  Musculoskeletal:        General: Normal range of motion.     Cervical back: Normal range of motion.  Neurological:     General: No focal deficit present.     Mental Status: He is alert and oriented to person, place, and time.  Psychiatric:        Attention and Perception: Attention and perception normal.        Mood and Affect: Mood and affect normal.        Speech: Speech normal.        Behavior: Behavior normal. Behavior is cooperative.        Thought Content: Thought content normal.        Cognition and Memory: Cognition and memory normal.        Judgment: Judgment normal.     Review of Systems  Constitutional:  Negative for chills and fever.  Respiratory:  Negative for shortness of breath.   Cardiovascular:  Negative for chest pain and palpitations.  Gastrointestinal:  Negative for  abdominal pain.  Neurological:  Negative for headaches.  Psychiatric/Behavioral:  Positive for depression and substance  abuse. The patient is nervous/anxious.   All other systems reviewed and are negative.   Blood pressure 115/77, pulse 63, temperature 97.7 F (36.5 C), temperature source Oral, resp. rate 18, height 5\' 11"  (1.803 m), weight 92.1 kg, SpO2 98%.Body mass index is 28.31 kg/m.  General Appearance: Casual  Eye Contact:  Good  Speech:  Normal Rate  Volume:  Normal  Mood:  Anxious and Depressed, "a little bit"  Affect:  Congruent  Thought Process:  Coherent  Orientation:  Full (Time, Place, and Person)  Thought Content:  WDL and Logical  Suicidal Thoughts:  No  Homicidal Thoughts:  No  Memory:  Immediate;   Good Recent;   Good Remote;   Good  Judgement:  Fair  Insight:  Fair  Psychomotor Activity:  Normal  Concentration:  Concentration: Good and Attention Span: Good  Recall:  Good  Fund of Knowledge:  Good  Language:  Good  Akathisia:  No  Handed:  Right  AIMS (if indicated):     Assets:  Leisure Time Physical Health Resilience Social Support  ADL's:  Intact  Cognition:  WNL  Sleep:         Physical Exam: Physical Exam Constitutional:      General: He is not in acute distress.    Appearance: Normal appearance. He is not ill-appearing, toxic-appearing or diaphoretic.  HENT:     Nose: Nose normal.  Eyes:     General: No scleral icterus. Pulmonary:     Effort: Pulmonary effort is normal. No respiratory distress.  Musculoskeletal:        General: Normal range of motion.     Cervical back: Normal range of motion.  Neurological:     General: No focal deficit present.     Mental Status: He is alert and oriented to person, place, and time.  Psychiatric:        Attention and Perception: Attention and perception normal.        Mood and Affect: Mood and affect normal.        Speech: Speech normal.        Behavior: Behavior normal. Behavior is cooperative.         Thought Content: Thought content normal.        Cognition and Memory: Cognition and memory normal.        Judgment: Judgment normal.    Review of Systems  Constitutional:  Negative for chills and fever.  Respiratory:  Negative for shortness of breath.   Cardiovascular:  Negative for chest pain and palpitations.  Gastrointestinal:  Negative for abdominal pain.  Neurological:  Negative for headaches.  Psychiatric/Behavioral:  Positive for depression and substance abuse. The patient is nervous/anxious.   All other systems reviewed and are negative.  Blood pressure 115/77, pulse 63, temperature 97.7 F (36.5 C), temperature source Oral, resp. rate 18, height 5\' 11"  (1.803 m), weight 92.1 kg, SpO2 98%. Body mass index is 28.31 kg/m.   Treatment Plan Summary: Daily contact with patient to assess and evaluate symptoms and progress in treatment, Medication management, and Plan    MDD -Lexapro 10mg  oral daily   Agitation PRNs -haldol 5mg  oral every 6 hours PRN agitation  -cogentin 1mg  oral every 6 hours PRN tremors, agitation -zyprexa 5mg  oral every 8 hours prn anxiety -ativan 1mg  oral as needed anxiety, severe agitation -geodon 20mg  IM as needed agitation   Insomnia -trazodone 50mg  oral daily at bedtime   Anxiety -Hydroxyzine 50mg  oral every 6 hours PRN anxiety  PRNs -Tylenol 650mg  oral every 6 hours PRN mild pain -Maalox/Mylanta 30mL oral every 4 hours PRN indigestion -Pepto Bismol 524mg  oral once PRN diarrhea or loose stools, indigestion -Milk of magnesia 30mL oral daily PRN mild constipation  Asthma -Ventolin HFA 2 puff inhalation every 6 hours PRN wheezing  Nanine Means, NP 09/27/2022, 3:10 PM

## 2022-09-27 NOTE — Progress Notes (Addendum)
D- Pt is alert and oriented . Pt reported an anxiety of a 7/10 , minimal depression at this time. Pt denies any pain as well. Pt also denies experiencing any SI/HI or AVH at this time.   A- Scheduled medications administered to pt per MD orders. Support and encouragement provided. Frequent verbal contact made. Pt is pleasant , cooperative and socializing with peers. Routine safety checks conducted q15 minutes.   R- No adverse drug reactions noted. Pt verbally contracts for safety at this time. Pt compliant with medications and treatment plan. Pt interacts well with others on the unit . Pt remains safe at this time . Plan of care ongoing.

## 2022-09-27 NOTE — Group Note (Signed)
[facility] LCSW Group Therapy Note   Group Date: 09/27/2022 Start Time: 1030 End Time: 1130   Type of Therapy/Topic:  Group Therapy:  Emotion Regulation  Participation Level:  Did Not Attend   Mood:  Description of Group:    The purpose of this group is to assist patients in learning to regulate negative emotions and experience positive emotions. Patients will be guided to discuss ways in which they have been vulnerable to their negative emotions. These vulnerabilities will be juxtaposed with experiences of positive emotions or situations, and patients challenged to use positive emotions to combat negative ones. Special emphasis will be placed on coping with negative emotions in conflict situations, and patients will process healthy conflict resolution skills.  Therapeutic Goals: Patient will identify two positive emotions or experiences to reflect on in order to balance out negative emotions:  Patient will label two or more emotions that they find the most difficult to experience:  Patient will be able to demonstrate positive conflict resolution skills through discussion or role plays:   Summary of Patient Progress:   X    Therapeutic Modalities:   Cognitive Behavioral Therapy Feelings Identification Dialectical Behavioral Therapy   Harden Mo, LCSW

## 2022-09-27 NOTE — [facility] Counselor (Signed)
Patient has been admitted to [facility]. Patient to arrive on 09/28/2022 by 9AM.  CSW has made appropriate staff aware and requested that information be relayed to oncoming night and day shift.   CSW has requested that provider fulfill a 30 day supply and prescription for patient.   Taxi voucher and face sheet are placed in patient's chart, clipped in the front.  Patient needs face sheet at discharge.  Penni Homans, MSW, LCSW 09/27/2022 1:42 PM

## 2022-09-27 NOTE — Group Note (Signed)
Date:  09/27/2022 Time:  9:02 PM  Group Topic/Focus:  Goals Group:   The focus of this group is to help patients establish daily goals to achieve during treatment and discuss how the patient can incorporate goal setting into their daily lives to aide in recovery.    Participation Level:  Active  Participation Quality:  Appropriate  Affect:  Appropriate  Cognitive:  Appropriate  Insight: Appropriate  Engagement in Group:  Engaged  Modes of Intervention:  Socialization  Additional Comments:    Dallie Piles Eulla Kochanowski 09/27/2022, 9:02 PM

## 2022-09-28 DIAGNOSIS — F332 Major depressive disorder, recurrent severe without psychotic features: Secondary | ICD-10-CM | POA: Diagnosis present

## 2022-09-28 DIAGNOSIS — F322 Major depressive disorder, single episode, severe without psychotic features: Secondary | ICD-10-CM | POA: Diagnosis not present

## 2022-09-28 MED ORDER — HYDROXYZINE HCL 50 MG PO TABS: 50.0000 mg | ORAL_TABLET | Freq: Four times a day (QID) | ORAL | 0 refills | Status: AC | PRN

## 2022-09-28 MED ORDER — TRAZODONE HCL 50 MG PO TABS: 50.0000 mg | ORAL_TABLET | Freq: Every day | ORAL | 0 refills | Status: AC

## 2022-09-28 MED ORDER — ESCITALOPRAM OXALATE 10 MG PO TABS: 10.0000 mg | ORAL_TABLET | Freq: Every day | ORAL | 0 refills | Status: AC

## 2022-09-28 NOTE — Discharge Summary (Signed)
Physician Discharge Summary Note  Patient:  Jorge Thomas is an 47 y.o., male MRN:  [id number] DOB:  [birth year] Patient phone:  [phone] (home)  Patient address:   [address] [place] [address],  Total Time spent with patient: 45 minutes  Date of Admission:  09/22/2022 Date of Discharge: 09/28/2022  Reason for Admission:  depression with suicidal ideations and a plan along with alcohol dependence.  Principal Problem: MDD (major depressive disorder), single episode, severe , no psychosis (HCC) Discharge Diagnoses: Principal Problem:   MDD (major depressive disorder), single episode, severe , no psychosis (HCC) Active Problems:   Cocaine abuse (HCC)   Alcohol abuse   Major depressive disorder, recurrent severe without psychotic features (HCC)   Past Psychiatric History: depression, anxiety, alcohol and cocaine dependence  Past Medical History:  Past Medical History:  Diagnosis Date   Asthma     Past Surgical History:  Procedure Laterality Date   left hand surgery     left knee surery     Family History: History reviewed. No pertinent family history. Family Psychiatric  History: none Social History:  Social History   Substance and Sexual Activity  Alcohol Use Yes   Alcohol/week: 1.0 - 2.0 standard drink of alcohol   Types: 1 - 2 Cans of beer per week   Comment: daily     Social History   Substance and Sexual Activity  Drug Use No    Social History   Socioeconomic History   Marital status: Single    Spouse name: Not on file   Number of children: Not on file   Years of education: Not on file   Highest education level: Not on file  Occupational History   Not on file  Tobacco Use   Smoking status: Former    Current packs/day: 0.00    Types: Cigarettes    Quit date: 06/08/2010    Years since quitting: 12.3   Smokeless tobacco: Never  Substance and Sexual Activity   Alcohol use: Yes    Alcohol/week: 1.0 - 2.0 standard drink of alcohol     Types: 1 - 2 Cans of beer per week    Comment: daily   Drug use: No   Sexual activity: Not on file  Other Topics Concern   Not on file  Social History Narrative   Not on file   Social Determinants of Health   Financial Resource Strain: Not on file  Food Insecurity: No Food Insecurity (09/23/2022)   Hunger Vital Sign    Worried About Running Out of Food in the Last Year: Never true    Ran Out of Food in the Last Year: Never true  Transportation Needs: No Transportation Needs (09/23/2022)   PRAPARE - Administrator, Civil Service (Medical): No    Lack of Transportation (Non-Medical): No  Physical Activity: Not on file  Stress: Not on file  Social Connections: Not on file    Hospital Course:   48 yo male admitted for depression and suicidal ideations with a plan he soul not reveal, alcohol and cocaine abuse and dependency.  Medications and therapy were started.  He did get into [facility] and wants to transfer there to continue his recovery.  Today, he denies depression, anxiety "is always there", low level.  Denies suicidal/homicidal ideations, hallucinations, paranoia, and withdrawal symptoms/cravings.  He has met maximum capacity of hospitalization.  Discharge instructions provided along with Rx, crisis numbers, and transportation to [facility].  Physical Findings: AIMS:  , ,  ,  ,  CIWA:  CIWA-Ar Total: 1 COWS:     Musculoskeletal: Strength & Muscle Tone: within normal limits Gait & Station: normal Patient leans: N/A   Psychiatric Specialty Exam: Physical Exam Vitals and nursing note reviewed.  Constitutional:      Appearance: Normal appearance.  HENT:     Head: Normocephalic.     Nose: Nose normal.  Pulmonary:     Effort: Pulmonary effort is normal.  Musculoskeletal:        General: Normal range of motion.     Cervical back: Normal range of motion.  Neurological:     General: No focal deficit present.     Mental Status: He is alert and oriented to  person, place, and time.       Review of Systems  Psychiatric/Behavioral:  Positive for substance abuse. The patient is nervous/anxious.   All other systems reviewed and are negative.    Blood pressure 117/70, pulse 82, temperature (!) 97.5 F (36.4 C), resp. rate 18, height 5\' 11"  (1.803 m), weight 92.1 kg, SpO2 100%.Body mass index is 28.31 kg/m.  General Appearance: Casual  Eye Contact:  Good  Speech:  Normal Rate  Volume:  Normal  Mood:  Anxious  Affect:  Congruent  Thought Process:  Coherent  Orientation:  Full (Time, Place, and Person)  Thought Content:  Logical  Suicidal Thoughts:  No  Homicidal Thoughts:  No  Memory:  Immediate;   Good Recent;   Good Remote;   Good  Judgement:  Fair  Insight:  Fair  Psychomotor Activity:  Normal  Concentration:  Concentration: Good and Attention Span: Good  Recall:  Good  Fund of Knowledge:  Good  Language:  Good  Akathisia:  No  Handed:  Right  AIMS (if indicated):     Assets:  Leisure Time Physical Health Resilience Social Support  ADL's:  Intact  Cognition:  WNL  Sleep:         Physical Exam: Physical Exam Vitals and nursing note reviewed.  Constitutional:      Appearance: Normal appearance.  HENT:     Head: Normocephalic.     Nose: Nose normal.  Pulmonary:     Effort: Pulmonary effort is normal.  Musculoskeletal:        General: Normal range of motion.     Cervical back: Normal range of motion.  Neurological:     General: No focal deficit present.     Mental Status: He is alert and oriented to person, place, and time.    Review of Systems  Psychiatric/Behavioral:  Positive for substance abuse. The patient is nervous/anxious.   All other systems reviewed and are negative.  Blood pressure 137/72, pulse 63, temperature (!) 97.5 F (36.4 C), resp. rate 18, height 5\' 11"  (1.803 m), weight 92.1 kg, SpO2 97%. Body mass index is 28.31 kg/m.   Social History   Tobacco Use  Smoking Status Former   Current  packs/day: 0.00   Types: Cigarettes   Quit date: 06/08/2010   Years since quitting: 12.3  Smokeless Tobacco Never   Tobacco Cessation:  N/A, patient does not currently use tobacco products   Blood Alcohol level:  Lab Results  Component Value Date   ETH <10 09/23/2022    Metabolic Disorder Labs:  No results found for: "HGBA1C", "MPG" No results found for: "PROLACTIN" No results found for: "CHOL", "TRIG", "HDL", "CHOLHDL", "VLDL", "LDLCALC"  See Psychiatric Specialty Exam and Suicide Risk Assessment completed by Attending Physician prior to discharge.  Discharge  destination:  [facility]  Is patient on multiple antipsychotic therapies at discharge:  No   Has Patient had three or more failed trials of antipsychotic monotherapy by history:  No  Recommended Plan for Multiple Antipsychotic Therapies: NA  Discharge Instructions     Diet - low sodium heart healthy   Complete by: As directed    Discharge instructions   Complete by: As directed    Continue recovery at [facility]   Increase activity slowly   Complete by: As directed       Allergies as of 09/28/2022   No Known Allergies      Medication List     TAKE these medications      Indication  albuterol 108 (90 Base) MCG/ACT inhaler Commonly known as: VENTOLIN HFA Inhale 2 puffs into the lungs every 6 (six) hours as needed for wheezing.  Indication: Chronic Obstructive Lung Disease   escitalopram 10 MG tablet Commonly known as: LEXAPRO Take 1 tablet (10 mg total) by mouth daily.  Indication: Major Depressive Disorder   hydrOXYzine 50 MG tablet Commonly known as: ATARAX Take 1 tablet (50 mg total) by mouth every 6 (six) hours as needed for anxiety.  Indication: Feeling Anxious   traZODone 50 MG tablet Commonly known as: DESYREL Take 1 tablet (50 mg total) by mouth at bedtime.  Indication: Trouble Sleeping        Follow-up Information     Services, [facility] .   Why: Elvera Bicker been  accepted to the program 09/28/2022 at [facility]. Contact information: [address] [place] [address] [phone]                 Follow-up recommendations:   Activity:  as tolerated Diet:  heart healthy diet MDD -Lexapro 10mg  oral daily   Insomnia -trazodone 50mg  oral daily at bedtime   Anxiety -Hydroxyzine 50mg  oral every 6 hours PRN anxiety  Comments:  Continue care at [facility]  Signed: Nanine Means, NP 09/28/2022, 8:07 AM

## 2022-09-28 NOTE — Plan of Care (Signed)
Problem: Education: Goal: Knowledge of General Education information will improve Description: Including pain rating scale, medication(s)/side effects and non-pharmacologic comfort measures 09/28/2022 0141 by Loreen Freud, RN Outcome: Adequate for Discharge 09/27/2022 2251 by Loreen Freud, RN Outcome: Progressing   Problem: Health Behavior/Discharge Planning: Goal: Ability to manage health-related needs will improve 09/28/2022 0141 by Loreen Freud, RN Outcome: Adequate for Discharge 09/27/2022 2251 by Loreen Freud, RN Outcome: Progressing   Problem: Clinical Measurements: Goal: Ability to maintain clinical measurements within normal limits will improve 09/28/2022 0141 by Loreen Freud, RN Outcome: Adequate for Discharge 09/27/2022 2251 by Loreen Freud, RN Outcome: Progressing Goal: Will remain free from infection 09/28/2022 0141 by Loreen Freud, RN Outcome: Adequate for Discharge 09/27/2022 2251 by Loreen Freud, RN Outcome: Progressing Goal: Diagnostic test results will improve 09/28/2022 0141 by Loreen Freud, RN Outcome: Adequate for Discharge 09/27/2022 2251 by Loreen Freud, RN Outcome: Progressing Goal: Respiratory complications will improve 09/28/2022 0141 by Loreen Freud, RN Outcome: Adequate for Discharge 09/27/2022 2251 by Loreen Freud, RN Outcome: Progressing Goal: Cardiovascular complication will be avoided 09/28/2022 0141 by Loreen Freud, RN Outcome: Adequate for Discharge 09/27/2022 2251 by Loreen Freud, RN Outcome: Progressing   Problem: Activity: Goal: Risk for activity intolerance will decrease 09/28/2022 0141 by Loreen Freud, RN Outcome: Adequate for Discharge 09/27/2022 2251 by Loreen Freud, RN Outcome: Progressing   Problem: Nutrition: Goal: Adequate nutrition will be maintained 09/28/2022 0141 by Loreen Freud, RN Outcome: Adequate for Discharge 09/27/2022 2251 by Loreen Freud, RN Outcome: Progressing   Problem:  Coping: Goal: Level of anxiety will decrease 09/28/2022 0141 by Loreen Freud, RN Outcome: Adequate for Discharge 09/27/2022 2251 by Loreen Freud, RN Outcome: Progressing   Problem: Elimination: Goal: Will not experience complications related to bowel motility 09/28/2022 0141 by Loreen Freud, RN Outcome: Adequate for Discharge 09/27/2022 2251 by Loreen Freud, RN Outcome: Progressing Goal: Will not experience complications related to urinary retention 09/28/2022 0141 by Loreen Freud, RN Outcome: Adequate for Discharge 09/27/2022 2251 by Loreen Freud, RN Outcome: Progressing   Problem: Pain Managment: Goal: General experience of comfort will improve 09/28/2022 0141 by Loreen Freud, RN Outcome: Adequate for Discharge 09/27/2022 2251 by Loreen Freud, RN Outcome: Progressing   Problem: Safety: Goal: Ability to remain free from injury will improve 09/28/2022 0141 by Loreen Freud, RN Outcome: Adequate for Discharge 09/27/2022 2251 by Loreen Freud, RN Outcome: Progressing   Problem: Skin Integrity: Goal: Risk for impaired skin integrity will decrease 09/28/2022 0141 by Loreen Freud, RN Outcome: Adequate for Discharge 09/27/2022 2251 by Loreen Freud, RN Outcome: Progressing   Problem: Education: Goal: Knowledge of [place] General Education information/materials will improve 09/28/2022 0141 by Loreen Freud, RN Outcome: Adequate for Discharge 09/27/2022 2251 by Loreen Freud, RN Outcome: Progressing Goal: Emotional status will improve 09/28/2022 0141 by Loreen Freud, RN Outcome: Adequate for Discharge 09/27/2022 2251 by Loreen Freud, RN Outcome: Progressing Goal: Mental status will improve 09/28/2022 0141 by Loreen Freud, RN Outcome: Adequate for Discharge 09/27/2022 2251 by Loreen Freud, RN Outcome: Progressing Goal: Verbalization of understanding the information provided will improve 09/28/2022 0141 by Loreen Freud, RN Outcome: Adequate for  Discharge 09/27/2022 2251 by Loreen Freud, RN Outcome: Progressing   Problem: Activity: Goal: Interest or engagement in activities will improve 09/28/2022 0141 by Loreen Freud, RN Outcome: Adequate for Discharge 09/27/2022 2251 by Loreen Freud, RN Outcome: Progressing Goal: Sleeping patterns will improve 09/28/2022 0141 by Loreen Freud, RN Outcome: Adequate for Discharge 09/27/2022 2251 by Loreen Freud, RN Outcome: Progressing   Problem: Coping: Goal:  Ability to verbalize frustrations and anger appropriately will improve 09/28/2022 0141 by Loreen Freud, RN Outcome: Adequate for Discharge 09/27/2022 2251 by Loreen Freud, RN Outcome: Progressing Goal: Ability to demonstrate self-control will improve 09/28/2022 0141 by Loreen Freud, RN Outcome: Adequate for Discharge 09/27/2022 2251 by Loreen Freud, RN Outcome: Progressing   Problem: Health Behavior/Discharge Planning: Goal: Identification of resources available to assist in meeting health care needs will improve 09/28/2022 0141 by Loreen Freud, RN Outcome: Adequate for Discharge 09/27/2022 2251 by Loreen Freud, RN Outcome: Progressing Goal: Compliance with treatment plan for underlying cause of condition will improve 09/28/2022 0141 by Loreen Freud, RN Outcome: Adequate for Discharge 09/27/2022 2251 by Loreen Freud, RN Outcome: Progressing   Problem: Physical Regulation: Goal: Ability to maintain clinical measurements within normal limits will improve 09/28/2022 0141 by Loreen Freud, RN Outcome: Adequate for Discharge 09/27/2022 2251 by Loreen Freud, RN Outcome: Progressing   Problem: Safety: Goal: Periods of time without injury will increase 09/28/2022 0141 by Loreen Freud, RN Outcome: Adequate for Discharge 09/27/2022 2251 by Loreen Freud, RN Outcome: Progressing

## 2022-09-28 NOTE — Progress Notes (Signed)
Nurse Reached out to on call provider , several times, Nurse finally contacted (Dr Marlou Porch) at  [phone]. Pt is supposed to discharge this morning at 0700 am but he has no orders ,no med reconciliation and no suicide assessment completed by an MD. Md said nurse to call another provider Marijean Niemann NP) .  Nure finally reached Asher Muir NP ) and she said that she will put in those orders .

## 2022-09-28 NOTE — Discharge Planning (Addendum)
1308 am Patient  is ready to be discharged, discharged orders in the chart  Pt has completed the pt has completed the suicide safety plan. He been given his belongings and prescriptions this morning. Patient was also educated on the need to  on his medications . Pt verbalized understanding .   0700 am Nurse was about to call transport company , when Provider asked to wait and not call  as she was coming up to add two more prescriptions to patients medication.

## 2022-09-28 NOTE — Progress Notes (Addendum)
2315 Pt is supposed to discharge at 0700 am , I am trying to work on his discharge paperwork, Pt has no discharge orders or suicide assessment review from MD. I have called multiple providers as well as sent secure messages and no one has responded to my calls.     0030 I have reached out to the [facility] for help as no one is responding to my calls . The [facility] helped me to get in touch with on-call provider who then told me she could not put in the discharge orders. We currently waiting for the on-call provider .

## 2022-09-28 NOTE — Progress Notes (Signed)
Patient discharged at this time via taxi to [facility]. All discharge instructions given with prescriptions and filled medications for 30days use.

## 2022-09-28 NOTE — [facility] Suicide Risk Assessment (Signed)
[facility] Discharge Suicide Risk Assessment   Principal Problem: MDD (major depressive disorder), single episode, severe , no psychosis (HCC) Discharge Diagnoses: Principal Problem:   MDD (major depressive disorder), single episode, severe , no psychosis (HCC) Active Problems:   Cocaine abuse (HCC)   Alcohol abuse   Major depressive disorder, recurrent severe without psychotic features (HCC)   Total Time spent with patient: 45 minutes  Musculoskeletal: Strength & Muscle Tone: within normal limits Gait & Station: normal Patient leans: N/A  Psychiatric Specialty Exam: Physical Exam Vitals and nursing note reviewed.  Constitutional:      Appearance: Normal appearance.  HENT:     Head: Normocephalic.     Nose: Nose normal.  Pulmonary:     Effort: Pulmonary effort is normal.  Musculoskeletal:        General: Normal range of motion.     Cervical back: Normal range of motion.  Neurological:     General: No focal deficit present.     Mental Status: He is alert and oriented to person, place, and time.     Review of Systems  Psychiatric/Behavioral:  Positive for substance abuse. The patient is nervous/anxious.   All other systems reviewed and are negative.   Blood pressure 117/70, pulse 82, temperature (!) 97.5 F (36.4 C), resp. rate 18, height 5\' 11"  (1.803 m), weight 92.1 kg, SpO2 100%.Body mass index is 28.31 kg/m.  General Appearance: Casual  Eye Contact:  Good  Speech:  Normal Rate  Volume:  Normal  Mood:  Anxious  Affect:  Congruent  Thought Process:  Coherent  Orientation:  Full (Time, Place, and Person)  Thought Content:  Logical  Suicidal Thoughts:  No  Homicidal Thoughts:  No  Memory:  Immediate;   Good Recent;   Good Remote;   Good  Judgement:  Fair  Insight:  Fair  Psychomotor Activity:  Normal  Concentration:  Concentration: Good and Attention Span: Good  Recall:  Good  Fund of Knowledge:  Good  Language:  Good  Akathisia:  No  Handed:  Right  AIMS (if  indicated):     Assets:  Leisure Time Physical Health Resilience Social Support  ADL's:  Intact  Cognition:  WNL  Sleep:        Physical Exam: Physical Exam Vitals and nursing note reviewed.  Constitutional:      Appearance: Normal appearance.  HENT:     Head: Normocephalic.     Nose: Nose normal.  Pulmonary:     Effort: Pulmonary effort is normal.  Musculoskeletal:        General: Normal range of motion.     Cervical back: Normal range of motion.  Neurological:     General: No focal deficit present.     Mental Status: He is alert and oriented to person, place, and time.    Review of Systems  Psychiatric/Behavioral:  Positive for substance abuse. The patient is nervous/anxious.   All other systems reviewed and are negative.  Blood pressure 117/70, pulse 82, temperature (!) 97.5 F (36.4 C), resp. rate 18, height 5\' 11"  (1.803 m), weight 92.1 kg, SpO2 100%. Body mass index is 28.31 kg/m.  Mental Status Per Nursing Assessment::   On Admission:  NA  Demographic Factors:  Male and Low socioeconomic status  Loss Factors: NA  Historical Factors: NA  Risk Reduction Factors:   Sense of responsibility to family, Positive social support, and Positive coping skills or problem solving skills  Continued Clinical Symptoms:  Anxiety, mild  Cognitive Features That Contribute To Risk:  None    Suicide Risk:  Minimal: No identifiable suicidal ideation.  Patients presenting with no risk factors but with morbid ruminations; may be classified as minimal risk based on the severity of the depressive symptoms   Follow-up Information     Services, [facility] .   Why: Elvera Bicker been accepted to the program 09/28/2022 at [facility]. Contact information: [address] [place] [address] [phone]                 Plan Of Care/Follow-up recommendations:  Activity:  as tolerated Diet:  heart healthy diet MDD -Lexapro 10mg  oral daily   Insomnia -trazodone  50mg  oral daily at bedtime   Anxiety -Hydroxyzine 50mg  oral every 6 hours PRN anxiety  Nanine Means, NP 09/28/2022, 6:32 AM

## 2023-12-28 ENCOUNTER — Encounter (HOSPITAL_COMMUNITY): Payer: Self-pay | Admitting: Emergency Medicine

## 2023-12-28 ENCOUNTER — Other Ambulatory Visit: Payer: Self-pay

## 2023-12-28 ENCOUNTER — Emergency Department (HOSPITAL_COMMUNITY)
Admission: EM | Admit: 2023-12-28 | Discharge: 2023-12-28 | Disposition: A | Discharge status: Home / Self Care | Payer: Self-pay | Attending: Emergency Medicine | Admitting: Emergency Medicine

## 2023-12-28 ENCOUNTER — Emergency Department (HOSPITAL_COMMUNITY): Payer: Self-pay

## 2023-12-28 DIAGNOSIS — M545 Low back pain, unspecified: Secondary | ICD-10-CM | POA: Insufficient documentation

## 2023-12-28 DIAGNOSIS — J101 Influenza due to other identified influenza virus with other respiratory manifestations: Secondary | ICD-10-CM | POA: Insufficient documentation

## 2023-12-28 DIAGNOSIS — R350 Frequency of micturition: Secondary | ICD-10-CM | POA: Insufficient documentation

## 2023-12-28 LAB — BASIC METABOLIC PANEL WITH GFR
Anion gap: 12 (ref 5–15)
BUN: 10 mg/dL (ref 6–20)
CO2: 24 mmol/L (ref 22–32)
Calcium: 9.1 mg/dL (ref 8.9–10.3)
Chloride: 103 mmol/L (ref 98–111)
Creatinine, Ser: 0.96 mg/dL (ref 0.61–1.24)
GFR, Estimated: >60 mL/min
Glucose, Bld: 107 mg/dL — ABNORMAL HIGH (ref 70–99)
Potassium: 4.5 mmol/L (ref 3.5–5.1)
Sodium: 138 mmol/L (ref 135–145)

## 2023-12-28 LAB — URINALYSIS, ROUTINE W REFLEX MICROSCOPIC
Bilirubin Urine: NEGATIVE
Glucose, UA: NEGATIVE mg/dL
Hgb urine dipstick: NEGATIVE
Ketones, ur: NEGATIVE mg/dL
Leukocytes,Ua: NEGATIVE
Nitrite: NEGATIVE
Protein, ur: NEGATIVE mg/dL
Specific Gravity, Urine: 1.003 — ABNORMAL LOW (ref 1.005–1.030)
pH: 7 (ref 5.0–8.0)

## 2023-12-28 LAB — CBC
HCT: 42.6 % (ref 39.0–52.0)
Hemoglobin: 13.9 g/dL (ref 13.0–17.0)
MCH: 28.7 pg (ref 26.0–34.0)
MCHC: 32.6 g/dL (ref 30.0–36.0)
MCV: 87.8 fL (ref 80.0–100.0)
Platelets: 284 K/uL (ref 150–400)
RBC: 4.85 MIL/uL (ref 4.22–5.81)
RDW: 13.2 % (ref 11.5–15.5)
WBC: 4.5 K/uL (ref 4.0–10.5)
nRBC: 0 % (ref 0.0–0.2)

## 2023-12-28 LAB — RESP PANEL BY RT-PCR (RSV, FLU A&B, COVID)  RVPGX2
Influenza A by PCR: POSITIVE — AB
Influenza B by PCR: NEGATIVE
Resp Syncytial Virus by PCR: NEGATIVE
SARS Coronavirus 2 by RT PCR: NEGATIVE

## 2023-12-28 MED ORDER — OSELTAMIVIR PHOSPHATE 75 MG PO CAPS: 75.0000 mg | ORAL_CAPSULE | Freq: Two times a day (BID) | ORAL | 0 refills | Status: AC

## 2023-12-28 MED ORDER — ONDANSETRON HCL 4 MG PO TABS: 4.0000 mg | ORAL_TABLET | Freq: Three times a day (TID) | ORAL | 0 refills | Status: AC | PRN

## 2023-12-28 MED ORDER — ACETAMINOPHEN 325 MG PO TABS
650.0000 mg | ORAL_TABLET | Freq: Once | ORAL | Status: AC
  Administered 2023-12-28: 650 mg via ORAL
  Filled 2023-12-28: qty 2

## 2023-12-28 NOTE — ED Triage Notes (Signed)
 Pov c/o bilateral kidney/flank pain that started this past Monday. Pt started metabolic steroids for about a month. Pt states has had nausea/headaches.  Pt endorses urine frequecy. Pt states has had fevers up to 106 F this past week as well as coughing up yellow phlegm

## 2023-12-28 NOTE — ED Provider Notes (Signed)
 " [place] [facility] DE[facility] AT [facility] Provider Note   CSN: [id number] Arrival date & time: 12/28/23  1052     Patient presents with: Flank Pain   Jorge Thomas is a 48 y.o. male presenting for evaluation of 6-day history of bilateral back/flank pain which has been intermittent, described as sharp pain sometimes associated with movement but can also occur at rest.  He also endorses generalized headache, nausea without emesis, increased urinary frequency and a reported fever to 100.6 along with cough with occasional yellow sputum.  He denies chest pain, shortness of breath, abdominal pain, he does endorse increased urinary frequency but denies dysuria.  He has recently been using anabolic steroids and is concerned about possible damage to his kidneys from this exposure.  He did not get a flu vaccine this year but has had positive exposures to others with similar symptoms.   The history is provided by the patient.       Prior to Admission medications  Medication Sig Start Date End Date Taking? Authorizing Provider  fluticasone  (FLOVENT  HFA) 110 MCG/ACT inhaler Inhale 2 puffs into the lungs daily as needed (as needed). 07/18/15  Yes [provider]  ondansetron  (ZOFRAN ) 4 MG tablet Take 1 tablet (4 mg total) by mouth every 8 (eight) hours as needed for nausea or vomiting. 12/28/23  Yes Brigham Cobbins, PA-C  oseltamivir  (TAMIFLU ) 75 MG capsule Take 1 capsule (75 mg total) by mouth every 12 (twelve) hours. 12/28/23  Yes Kevyn Wengert, Mliss, PA-C  albuterol  (PROVENTIL  HFA;VENTOLIN  HFA) 108 (90 BASE) MCG/ACT inhaler Inhale 2 puffs into the lungs every 6 (six) hours as needed for wheezing.    [provider]  escitalopram  (LEXAPRO ) 10 MG tablet Take 1 tablet (10 mg total) by mouth daily. 09/28/22 10/28/22  Jacquetta Sharlot GRADE, NP  traZODone  (DESYREL ) 50 MG tablet Take 1 tablet (50 mg total) by mouth at bedtime. 09/28/22 10/28/22  Jacquetta Sharlot GRADE, NP    Allergies: Bee venom     Review of Systems  Constitutional:  Positive for chills. Negative for fever.  HENT:  Positive for rhinorrhea. Negative for congestion and sore throat.   Eyes: Negative.   Respiratory:  Positive for cough. Negative for chest tightness and shortness of breath.   Cardiovascular:  Negative for chest pain.  Gastrointestinal:  Positive for nausea. Negative for abdominal pain and vomiting.  Genitourinary:  Positive for frequency. Negative for dysuria and urgency.  Musculoskeletal:  Negative for arthralgias, joint swelling and neck pain.  Skin: Negative.  Negative for rash and wound.  Neurological:  Positive for headaches. Negative for dizziness, weakness, light-headedness and numbness.  Psychiatric/Behavioral: Negative.      Updated Vital Signs BP 135/75 (BP Location: Right Arm)   Pulse 72   Temp 98.1 F (36.7 C) (Oral)   Resp 20   Ht 5' 11 (1.803 m)   Wt 108.9 kg   SpO2 95%   BMI 33.47 kg/m   Physical Exam Vitals and nursing note reviewed.  Constitutional:      Appearance: He is well-developed.  HENT:     Head: Normocephalic and atraumatic.  Eyes:     Conjunctiva/sclera: Conjunctivae normal.  Cardiovascular:     Rate and Rhythm: Normal rate and regular rhythm.     Heart sounds: Normal heart sounds.  Pulmonary:     Effort: Pulmonary effort is normal.     Breath sounds: Normal breath sounds. No stridor. No wheezing or rhonchi.  Abdominal:  General: Bowel sounds are normal.     Palpations: Abdomen is soft.     Tenderness: There is no abdominal tenderness. There is no right CVA tenderness, left CVA tenderness or guarding.  Musculoskeletal:        General: Normal range of motion.     Cervical back: Normal range of motion.  Skin:    General: Skin is warm and dry.  Neurological:     General: No focal deficit present.     Mental Status: He is alert.     (all labs ordered are listed, but only abnormal results are displayed) Labs Reviewed  RESP PANEL BY RT-PCR (RSV,  FLU A&B, COVID)  RVPGX2 - Abnormal; Notable for the following components:      Result Value   Influenza A by PCR POSITIVE (*)    All other components within normal limits  URINALYSIS, ROUTINE W REFLEX MICROSCOPIC - Abnormal; Notable for the following components:   Color, Urine STRAW (*)    Specific Gravity, Urine 1.003 (*)    All other components within normal limits  BASIC METABOLIC PANEL WITH GFR - Abnormal; Notable for the following components:   Glucose, Bld 107 (*)    All other components within normal limits  CBC    EKG: None  Radiology: DG Chest Portable 1 View Result Date: 12/28/2023 CLINICAL DATA:  Nausea and fever with bilateral flank pain and cough. EXAM: PORTABLE CHEST 1 VIEW COMPARISON:  January 19, 2010 FINDINGS: The heart size and mediastinal contours are within normal limits. Both lungs are clear. The visualized skeletal structures are unremarkable. IMPRESSION: No active disease. Electronically Signed   By: Suzen Dials M.D.   On: 12/28/2023 14:18     Procedures   Medications Ordered in the ED  acetaminophen  (TYLENOL ) tablet 650 mg (650 mg Oral Given 12/28/23 1303)                                    Medical Decision Making Patient with multiple complaints, mostly flulike symptoms who test positive for influenza A today.  He also endorses bilateral low back pain and has had increased urinary frequency but denies dysuria.  Labs and imaging has been obtained and is reassuring, he has tested positive for influenza A.  We discussed symptoms, what to expect, return precautions.  We discussed Tamiflu  and he does not want this medication.  This was prescribed along with Zofran  to help with his nausea.  Amount and/or Complexity of Data Reviewed Labs: ordered.    Details: Labs reviewed, positive for influenza A, no UTI, his B med and CBC are normal. Radiology: ordered.    Details: Chest x-ray reviewed, no acute cardiopulmonary findings.  Risk OTC  drugs. Prescription drug management.        Final diagnoses:  Influenza A    ED Discharge Orders          Ordered    oseltamivir  (TAMIFLU ) 75 MG capsule  Every 12 hours        12/28/23 1424    ondansetron  (ZOFRAN ) 4 MG tablet  Every 8 hours PRN        12/28/23 1424               Keia Rask, PA-C 12/28/23 1429    Kingsley, Victoria K, DO 12/28/23 1457  "

## 2023-12-28 NOTE — Discharge Instructions (Signed)
 Rest,  Drink plenty of fluids, especially water,  sports drinks with electrolytes and/or gingerale or sprite.  Any other soda's will not be hydrating.   Take your nyquill  for achiness and fever reduction.  You may take the tamiflu  if you desire.  This medicine may improve your flu symptoms 1-2 days sooner.  Get rechecked for any increased shortness of breath,  Increased fever or increasing weakness.

## 2024-01-27 ENCOUNTER — Ambulatory Visit: Payer: Self-pay
